# Patient Record
Sex: Female | Born: 1945 | Race: Black or African American | Hispanic: No | Smoking: Former smoker
Health system: Southern US, Community
[De-identification: ages and names within clinical notes are randomized; demographics above are authoritative.]

## PROBLEM LIST (undated history)

## (undated) DIAGNOSIS — R87629 Unspecified abnormal cytological findings in specimens from vagina: Secondary | ICD-10-CM

## (undated) DIAGNOSIS — D649 Anemia, unspecified: Secondary | ICD-10-CM

## (undated) DIAGNOSIS — K37 Unspecified appendicitis: Secondary | ICD-10-CM

## (undated) DIAGNOSIS — M1990 Unspecified osteoarthritis, unspecified site: Secondary | ICD-10-CM

## (undated) DIAGNOSIS — M8580 Other specified disorders of bone density and structure, unspecified site: Secondary | ICD-10-CM

## (undated) DIAGNOSIS — H269 Unspecified cataract: Secondary | ICD-10-CM

## (undated) DIAGNOSIS — K7689 Other specified diseases of liver: Secondary | ICD-10-CM

## (undated) DIAGNOSIS — L02215 Cutaneous abscess of perineum: Secondary | ICD-10-CM

## (undated) HISTORY — DX: Unspecified appendicitis: K37

## (undated) HISTORY — DX: Unspecified osteoarthritis, unspecified site: M19.90

## (undated) HISTORY — PX: COLONOSCOPY: SHX174

## (undated) HISTORY — DX: Unspecified abnormal cytological findings in specimens from vagina: R87.629

## (undated) HISTORY — DX: Anemia, unspecified: D64.9

## (undated) HISTORY — PX: UTERINE FIBROID SURGERY: SHX826

## (undated) HISTORY — DX: Other specified disorders of bone density and structure, unspecified site: M85.80

## (undated) HISTORY — DX: Other specified diseases of liver: K76.89

## (undated) HISTORY — DX: Unspecified cataract: H26.9

## (undated) HISTORY — DX: Cutaneous abscess of perineum: L02.215

## (undated) HISTORY — PX: POLYPECTOMY: SHX149

## (undated) HISTORY — PX: WRIST SURGERY: SHX841

---

## 1968-04-22 HISTORY — PX: APPENDECTOMY: SHX54

## 2008-08-24 ENCOUNTER — Emergency Department (HOSPITAL_COMMUNITY): Admission: EM | Admit: 2008-08-24 | Discharge: 2008-08-24 | Payer: Self-pay | Admitting: Emergency Medicine

## 2009-11-02 ENCOUNTER — Emergency Department (HOSPITAL_COMMUNITY): Admission: EM | Admit: 2009-11-02 | Discharge: 2009-11-02 | Payer: Self-pay | Admitting: Emergency Medicine

## 2010-07-08 LAB — POCT I-STAT, CHEM 8
Chloride: 111 mEq/L (ref 96–112)
HCT: 41 % (ref 36.0–46.0)
Hemoglobin: 13.9 g/dL (ref 12.0–15.0)
Potassium: 3.9 mEq/L (ref 3.5–5.1)
Sodium: 142 mEq/L (ref 135–145)
TCO2: 23 mmol/L (ref 0–100)

## 2010-07-08 LAB — POCT CARDIAC MARKERS: CKMB, poc: <1 ng/mL — ABNORMAL LOW (ref 1.0–8.0)

## 2010-09-17 ENCOUNTER — Emergency Department (HOSPITAL_COMMUNITY)
Admission: EM | Admit: 2010-09-17 | Discharge: 2010-09-17 | Disposition: A | Discharge status: Home / Self Care | Payer: Medicare Other | Attending: Emergency Medicine | Admitting: Emergency Medicine

## 2010-09-17 DIAGNOSIS — H11419 Vascular abnormalities of conjunctiva, unspecified eye: Secondary | ICD-10-CM | POA: Insufficient documentation

## 2010-09-17 DIAGNOSIS — H1089 Other conjunctivitis: Secondary | ICD-10-CM | POA: Insufficient documentation

## 2010-09-17 DIAGNOSIS — H5789 Other specified disorders of eye and adnexa: Secondary | ICD-10-CM | POA: Insufficient documentation

## 2010-11-22 ENCOUNTER — Emergency Department (HOSPITAL_COMMUNITY)
Admission: EM | Admit: 2010-11-22 | Discharge: 2010-11-22 | Disposition: A | Discharge status: Home / Self Care | Payer: Medicare Other | Attending: Emergency Medicine | Admitting: Emergency Medicine

## 2010-11-22 DIAGNOSIS — H109 Unspecified conjunctivitis: Secondary | ICD-10-CM | POA: Insufficient documentation

## 2010-11-22 DIAGNOSIS — H11419 Vascular abnormalities of conjunctiva, unspecified eye: Secondary | ICD-10-CM | POA: Insufficient documentation

## 2010-11-22 DIAGNOSIS — H5789 Other specified disorders of eye and adnexa: Secondary | ICD-10-CM | POA: Insufficient documentation

## 2012-01-15 ENCOUNTER — Emergency Department (HOSPITAL_COMMUNITY): Payer: Medicare Other

## 2012-01-15 ENCOUNTER — Emergency Department (HOSPITAL_COMMUNITY)
Admission: EM | Admit: 2012-01-15 | Discharge: 2012-01-15 | Disposition: A | Discharge status: Home / Self Care | Payer: Medicare Other | Attending: Emergency Medicine | Admitting: Emergency Medicine

## 2012-01-15 ENCOUNTER — Encounter (HOSPITAL_COMMUNITY): Payer: Self-pay | Admitting: Emergency Medicine

## 2012-01-15 DIAGNOSIS — R51 Headache: Secondary | ICD-10-CM | POA: Insufficient documentation

## 2012-01-15 DIAGNOSIS — R079 Chest pain, unspecified: Secondary | ICD-10-CM | POA: Insufficient documentation

## 2012-01-15 DIAGNOSIS — R599 Enlarged lymph nodes, unspecified: Secondary | ICD-10-CM | POA: Insufficient documentation

## 2012-01-15 DIAGNOSIS — R42 Dizziness and giddiness: Secondary | ICD-10-CM | POA: Diagnosis not present

## 2012-01-15 LAB — BASIC METABOLIC PANEL
BUN: 8 mg/dL (ref 6–23)
Calcium: 9.9 mg/dL (ref 8.4–10.5)
Creatinine, Ser: 0.9 mg/dL (ref 0.50–1.10)
GFR calc Af Amer: 76 mL/min — ABNORMAL LOW (ref >90)
Potassium: 3.9 mEq/L (ref 3.5–5.1)
Sodium: 142 mEq/L (ref 135–145)

## 2012-01-15 LAB — CBC WITH DIFFERENTIAL/PLATELET
HCT: 41.3 % (ref 36.0–46.0)
Hemoglobin: 13.9 g/dL (ref 12.0–15.0)
Lymphocytes Relative: 23 % (ref 12–46)
MCHC: 33.7 g/dL (ref 30.0–36.0)
MCV: 88.4 fL (ref 78.0–100.0)
Platelets: 310 10*3/uL (ref 150–400)
WBC: 5.3 10*3/uL (ref 4.0–10.5)

## 2012-01-15 MED ORDER — HYDROCODONE-ACETAMINOPHEN 5-325 MG PO TABS
2.0000 | ORAL_TABLET | Freq: Once | ORAL | Status: AC
  Administered 2012-01-15: 2 via ORAL
  Filled 2012-01-15: qty 2

## 2012-01-15 MED ORDER — IOHEXOL 350 MG/ML SOLN
50.0000 mL | Freq: Once | INTRAVENOUS | Status: AC | PRN
  Administered 2012-01-15: 50 mL via INTRAVENOUS

## 2012-01-15 NOTE — ED Provider Notes (Signed)
History  This chart was scribed for Glynn Octave, MD by Ardeen Jourdain. This patient was seen in room TR09C/TR09C and the patient's care was started at 1102.  CSN: [id number]  Arrival date & time 01/15/12  1000   First MD Initiated Contact with Patient 01/15/12 1102      No chief complaint on file.    HPI Sherry Wilkinson is a 66 y.o. female who presents to the Emergency Department complaining of HA gradual onset, gradually worsening that started 6-8 weeks ago. She has associated non-spinning dizziness, and feeling of "water moving back and forth" in her head. She states she wakes up with a headache in the morning that improves throughout the day, but the pressure sensation remains. Pt denies fever, neck pain, visual disturbance, CP, cough, SOB, abdominal pain, nausea, emesis, diarrhea, back pain, weakness, numbness and rash as associated symptoms. She does not have a h/o chronic medical conditions. She denies smoking and alcohol use.   History reviewed. No pertinent past medical history.  No past surgical history on file.  No family history on file.  History  Substance Use Topics  . Smoking status: Never Smoker   . Smokeless tobacco: Not on file  . Alcohol Use: No   No OB history available.   Review of Systems A complete 10 system review of systems was obtained and all systems are negative except as noted in the HPI and PMH.   Allergies  Review of patient's allergies indicates no known allergies.  Home Medications  No current outpatient prescriptions on file.  Triage Vitals: BP 122/64  Pulse 68  Temp 98.2 F (36.8 C) (Oral)  Resp 18  SpO2 100%  Physical Exam  Nursing note and vitals reviewed. Constitutional: She is oriented to person, place, and time. She appears well-developed and well-nourished. No distress.  HENT:  Head: Normocephalic and atraumatic.  Eyes: EOM are normal. Pupils are equal, round, and reactive to light.       No nystagmus. Visual fields  fully confrontational.   Neck: Normal range of motion. Neck supple. No tracheal deviation present.  Cardiovascular: Normal rate, regular rhythm and normal heart sounds.   Pulmonary/Chest: Effort normal and breath sounds normal. No respiratory distress. She has no wheezes.  Abdominal: Soft.  Musculoskeletal: Normal range of motion.       Strength 5/5 in all extremities. Equal grip strength.   Neurological: She is alert and oriented to person, place, and time. No cranial nerve deficit.       Finger nose finger test normal. Negative pronator drift. Gait normal. Cranial nerves intact. No nystagmus. No ataxia. Negative Romberg's.   Skin: Skin is warm and dry.  Psychiatric: She has a normal mood and affect. Her behavior is normal.    ED Course  Procedures (including critical care time)  DIAGNOSTIC STUDIES: Oxygen Saturation is 100% on room air, normal by my interpretation.    COORDINATION OF CARE:  11:20-Discussed planned course of treatment with the patient including pain medication, CT of head and neck, blood work and EKG, who is agreeable at this time.   11:45-Medication Orders: Hydrocodone-acetaminophen (Norco/Vicodin) 5-325 mg per tablet 2 tablet-once  Labs Reviewed  BASIC METABOLIC PANEL - Abnormal; Notable for the following:    GFR calc non Af Amer 65 (*)     GFR calc Af Amer 76 (*)     All other components within normal limits  CBC WITH DIFFERENTIAL  TROPONIN I  LAB REPORT - SCANNED   Ct Angio Head  W/cm &/or Wo Cm  01/15/2012  *RADIOLOGY REPORT*  Clinical Data:  Headache  CT ANGIOGRAPHY HEAD AND NECK  Technique:  Multidetector CT imaging of the head and neck was performed using the standard protocol during bolus administration of intravenous contrast.  Multiplanar CT image reconstructions including MIPs were obtained to evaluate the vascular anatomy. Carotid stenosis measurements (when applicable) are obtained utilizing NASCET criteria, using the distal internal carotid diameter  as the denominator.  Contrast: 50mL OMNIPAQUE IOHEXOL 350 MG/ML SOLN  Comparison:   None.  CTA NECK  Findings:  Negative for mass or adenopathy in the neck.  8 mm right thyroid nodule.  Lung apices are clear.  Carotid artery is widely patent bilaterally without evidence of dissection or atherosclerotic disease.  Both vertebral arteries are widely patent to the basilar without stenosis.  Right vertebral artery is dominant.  No significant bony abnormality.   Review of the MIP images confirms the above findings.  IMPRESSION: Negative  CTA HEAD  Findings:  Both vertebral arteries are patent to the basilar.  PICA and AICA are patent bilaterally.  The basilar is widely patent. Superior cerebellar and posterior cerebral arteries are patent bilaterally without stenosis.  Cavernous carotid is widely patent without atherosclerotic disease. Anterior and middle cerebral arteries are widely patent bilaterally.  Negative for cerebral aneurysm.  Ventricle size is normal.  Mild patchy hypodensity in the cerebral white matter bilaterally compatible chronic microvascular ischemia. No acute infarct.  Negative for hemorrhage or mass.  Normal enhancement following contrast administration.   Review of the MIP images confirms the above findings.  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  No acute intracranial abnormality.  No significant intracranial vascular abnormality.   Original Report Authenticated By: Camelia Phenes, M.D.    Ct Angio Neck W/cm &/or Wo/cm  01/15/2012  *RADIOLOGY REPORT*  Clinical Data:  Headache  CT ANGIOGRAPHY HEAD AND NECK  Technique:  Multidetector CT imaging of the head and neck was performed using the standard protocol during bolus administration of intravenous contrast.  Multiplanar CT image reconstructions including MIPs were obtained to evaluate the vascular anatomy. Carotid stenosis measurements (when applicable) are obtained utilizing NASCET criteria, using the distal  internal carotid diameter as the denominator.  Contrast: 50mL OMNIPAQUE IOHEXOL 350 MG/ML SOLN  Comparison:   None.  CTA NECK  Findings:  Negative for mass or adenopathy in the neck.  8 mm right thyroid nodule.  Lung apices are clear.  Carotid artery is widely patent bilaterally without evidence of dissection or atherosclerotic disease.  Both vertebral arteries are widely patent to the basilar without stenosis.  Right vertebral artery is dominant.  No significant bony abnormality.   Review of the MIP images confirms the above findings.  IMPRESSION: Negative  CTA HEAD  Findings:  Both vertebral arteries are patent to the basilar.  PICA and AICA are patent bilaterally.  The basilar is widely patent. Superior cerebellar and posterior cerebral arteries are patent bilaterally without stenosis.  Cavernous carotid is widely patent without atherosclerotic disease. Anterior and middle cerebral arteries are widely patent bilaterally.  Negative for cerebral aneurysm.  Ventricle size is normal.  Mild patchy hypodensity in the cerebral white matter bilaterally compatible chronic microvascular ischemia. No acute infarct.  Negative for hemorrhage or mass.  Normal enhancement following contrast administration.   Review of the MIP images confirms the above findings.  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  No acute intracranial abnormality.  No significant  intracranial vascular abnormality.   Original Report Authenticated By: Camelia Phenes, M.D.      1. Headache       MDM  Daily posterior headaches x 8 weeks, resolving throughout day.  "water rushing" feeling.  No vertigo, weakness, numbness, tingling, vision changes.  nonfocal neurological exam, no vertigo, nystagmus, ataxia.  Normal gait. No focal motor or sensory deficit.  CT head to rule out anatomic pathology. No headache at this time.  Follow up with neuro.   Date: 01/15/2012  Rate: 59  Rhythm: normal sinus rhythm  QRS  Axis: normal  Intervals: normal  ST/T Wave abnormalities: normal  Conduction Disutrbances:none  Narrative Interpretation:   Old EKG Reviewed: unchanged         Glynn Octave, MD 01/16/12 1041

## 2012-01-15 NOTE — ED Notes (Signed)
Ambulatory without difficulty- denies dizziness, n/v or further complaints.

## 2012-01-15 NOTE — ED Notes (Signed)
H/a that has awaken her 6-8 times over the last  6-8 months no blurred vision some dizziness  Pain is in back of her head

## 2012-07-22 ENCOUNTER — Ambulatory Visit (INDEPENDENT_AMBULATORY_CARE_PROVIDER_SITE_OTHER): Payer: Medicare Other | Admitting: Obstetrics and Gynecology

## 2012-07-22 ENCOUNTER — Encounter: Payer: Self-pay | Admitting: Obstetrics and Gynecology

## 2012-07-22 DIAGNOSIS — G8929 Other chronic pain: Secondary | ICD-10-CM

## 2012-07-22 DIAGNOSIS — R1031 Right lower quadrant pain: Secondary | ICD-10-CM | POA: Insufficient documentation

## 2012-07-22 NOTE — Progress Notes (Signed)
Patient reports pain on her RLQ.

## 2012-07-22 NOTE — Progress Notes (Signed)
Patient ID: Sherry Wilkinson, female   DOB: 1945-09-09, 68 y.o.   MRN: [id number] 67 yo G3P0030 presenting today as a referral for evaluation of lesion seen on her cervix at the time of her cervical screening test during the free session. Patient denies any issues. She has been postmenopausal for greater than 15 years. Her pap smear this year was normal. She also reports the onset of RLQ pain over the past two weeks. The pain is stabbing, non-radiating. Ibuprofen alleviates the pain a little and there are no aggravating factors. She continues to have good appetite and reports regular bowel movement.  Abdomen: Soft, mild tenderness in RLQ, no rebound, no guarding Pelvic: Normal external genitalia. Normal appearing vagina, slightly atrophic. Normal cervix without any lesions. Small uterus, no adnexal masses or tenderness.  A/P 67 yo G3P0030 with RLQ pain -Normal cervix - Will obtain pelvic ultrasound for patient reassurance - Will refer to Primary care for evaluation of RLQ pain

## 2012-07-28 ENCOUNTER — Ambulatory Visit (HOSPITAL_COMMUNITY)
Admission: RE | Admit: 2012-07-28 | Discharge: 2012-07-28 | Disposition: A | Discharge status: Home / Self Care | Payer: Medicare Other | Source: Ambulatory Visit | Attending: Obstetrics and Gynecology | Admitting: Obstetrics and Gynecology

## 2012-07-28 DIAGNOSIS — R1031 Right lower quadrant pain: Secondary | ICD-10-CM | POA: Insufficient documentation

## 2012-07-28 DIAGNOSIS — Z780 Asymptomatic menopausal state: Secondary | ICD-10-CM | POA: Diagnosis not present

## 2012-07-28 DIAGNOSIS — D250 Submucous leiomyoma of uterus: Secondary | ICD-10-CM | POA: Insufficient documentation

## 2012-07-28 DIAGNOSIS — N949 Unspecified condition associated with female genital organs and menstrual cycle: Secondary | ICD-10-CM | POA: Insufficient documentation

## 2012-07-28 DIAGNOSIS — D259 Leiomyoma of uterus, unspecified: Secondary | ICD-10-CM | POA: Diagnosis not present

## 2012-07-29 ENCOUNTER — Telehealth: Payer: Self-pay | Admitting: Obstetrics and Gynecology

## 2012-07-29 ENCOUNTER — Telehealth: Payer: Self-pay | Admitting: General Practice

## 2012-07-29 NOTE — Telephone Encounter (Addendum)
Message copied by Toula Moos on Wed Jul 29, 2012  8:43 AM   ------CALLED PATIENT and notified of ultrasound result and that no interventions needed. Patient satisfied.         Message from: CONSTANT, PEGGY      Created: Tue Jul 28, 2012  4:10 PM       Patient will be contacted with ultrasound results for which no interventions are needed ------

## 2012-07-29 NOTE — Telephone Encounter (Signed)
Message copied by Kathee Delton on Wed Jul 29, 2012  1:14 PM ------      Message from: CONSTANT, Gigi Gin      Created: Tue Jul 28, 2012  4:08 PM       Please inform patient of a normal pelvic ultrasound which demonstrates the presence of 2 small fibroids for which no interventions are needed and normal ovaries            Peggy ------

## 2012-07-29 NOTE — Telephone Encounter (Signed)
Called patient, no answer- left message to call us back. 

## 2012-07-29 NOTE — Telephone Encounter (Signed)
Patient called back stating she was returning our phone call. Called patient back and informed her of normal ultrasound results. Patient verbalized understanding and had no further questions

## 2012-07-30 ENCOUNTER — Ambulatory Visit: Payer: Medicare Other | Admitting: Family Medicine

## 2012-08-04 ENCOUNTER — Ambulatory Visit (INDEPENDENT_AMBULATORY_CARE_PROVIDER_SITE_OTHER): Payer: Medicare Other | Admitting: Family Medicine

## 2012-08-04 ENCOUNTER — Encounter: Payer: Self-pay | Admitting: Family Medicine

## 2012-08-04 VITALS — BP 116/73 | HR 77 | Temp 98.7°F | Ht 61.0 in | Wt 142.0 lb

## 2012-08-04 DIAGNOSIS — K921 Melena: Secondary | ICD-10-CM | POA: Diagnosis not present

## 2012-08-04 DIAGNOSIS — R1011 Right upper quadrant pain: Secondary | ICD-10-CM

## 2012-08-04 DIAGNOSIS — Z1239 Encounter for other screening for malignant neoplasm of breast: Secondary | ICD-10-CM | POA: Diagnosis not present

## 2012-08-04 DIAGNOSIS — Z01419 Encounter for gynecological examination (general) (routine) without abnormal findings: Secondary | ICD-10-CM | POA: Insufficient documentation

## 2012-08-04 DIAGNOSIS — K7689 Other specified diseases of liver: Secondary | ICD-10-CM | POA: Diagnosis not present

## 2012-08-04 LAB — COMPREHENSIVE METABOLIC PANEL
ALT: 13 U/L (ref 0–35)
Alkaline Phosphatase: 77 U/L (ref 39–117)
BUN: 12 mg/dL (ref 6–23)
CO2: 25 mEq/L (ref 19–32)
Calcium: 10 mg/dL (ref 8.4–10.5)
Chloride: 105 mEq/L (ref 96–112)
Glucose, Bld: 97 mg/dL (ref 70–99)
Potassium: 4.2 mEq/L (ref 3.5–5.3)
Total Protein: 7.6 g/dL (ref 6.0–8.3)

## 2012-08-04 NOTE — Patient Instructions (Signed)
Abdominal Pain  Abdominal pain can be caused by many things. Your caregiver decides the seriousness of your pain by an examination and possibly blood tests and X-rays. Many cases can be observed and treated at home. Most abdominal pain is not caused by a disease and will probably improve without treatment. However, in many cases, more time must pass before a clear cause of the pain can be found. Before that point, it may not be known if you need more testing, or if hospitalization or surgery is needed.  HOME CARE INSTRUCTIONS   · Do not take laxatives unless directed by your caregiver.  · Take pain medicine only as directed by your caregiver.  · Only take over-the-counter or prescription medicines for pain, discomfort, or fever as directed by your caregiver.  · Try a clear liquid diet (broth, tea, or water) for as long as directed by your caregiver. Slowly move to a bland diet as tolerated.  SEEK IMMEDIATE MEDICAL CARE IF:   · The pain does not go away.  · You have a fever.  · You keep throwing up (vomiting).  · The pain is felt only in portions of the abdomen. Pain in the right side could possibly be appendicitis. In an adult, pain in the left lower portion of the abdomen could be colitis or diverticulitis.  · You pass bloody or black tarry stools.  MAKE SURE YOU:   · Understand these instructions.  · Will watch your condition.  · Will get help right away if you are not doing well or get worse.  Document Released: 01/16/2005 Document Revised: 07/01/2011 Document Reviewed: 11/25/2007  Exit[employer]® Patient Information ©2013 [employer].

## 2012-08-04 NOTE — Progress Notes (Signed)
Subjective:     Patient ID: Sherry Wilkinson, female   DOB: June 03, 1945, 67 y.o.   MRN: [id number]  HPI RUQ abdominal pain:Recwently started having RUQ abdominal pain in the last few weeks on and off,she denies any change in BM,she noticed blood in her stool 2 wks ago,she feels ok today. Liver cyst:She was told many years ago she has cyst in her liver,she is here for follow up,no change in skin color,she has RUQ abdominal pain on and off,currently asymptomatic. Blood in stool:2 wks ago she noticed blood in her stool one time,since then no other episode,she denies any change in her bowel movement,she does have occasional right side abdominal pain. Health maintenance;Not had colonoscopy and mammogram recently,she had PAP this year which was normal.  Past Medical History  Diagnosis Date  . Liver cyst     Review of Systems  Respiratory: Negative.   Cardiovascular: Negative.   Gastrointestinal: Positive for abdominal pain and blood in stool. Negative for nausea, vomiting, diarrhea, constipation and rectal pain.  Genitourinary: Negative.   Neurological: Negative.   All other systems reviewed and are negative.    Filed Vitals:   08/04/12 0907  BP: 116/73  Pulse: 77  Temp: 98.7 F (37.1 C)  TempSrc: Oral  Height: 5\' 1"  (1.549 m)  Weight: 142 lb (64.411 kg)       Objective:   Physical Exam  Nursing note and vitals reviewed. Constitutional: She is oriented to person, place, and time. She appears well-developed. No distress.  Cardiovascular: Normal rate, regular rhythm, normal heart sounds and intact distal pulses.   No murmur heard. Pulmonary/Chest: Effort normal and breath sounds normal. No respiratory distress. She has no wheezes. She exhibits no tenderness.  Abdominal: Soft. Bowel sounds are normal. She exhibits no distension and no mass. There is no tenderness. There is no rebound and no guarding.  Musculoskeletal: Normal range of motion. She exhibits no edema.  Neurological:  She is alert and oriented to person, place, and time.  Psychiatric: She has a normal mood and affect.       Assessment:     RLQ abdominal pain: Liver cyst: Blood in stool: Health maintenance; Breast cancer screening    Plan:     1. Abdominal exam benign,CMP ordered today. If abnormal will consider imaging her liver.  2. CMP ordered today.No sign suggestive of liver disease.  3. Referral to GI for colonoscopy done.  4. Up to dte with PAP test.I referred her to GI for colonoscopy and mammogram was ordered.

## 2012-08-04 NOTE — Assessment & Plan Note (Signed)
  Health maintenance;breast cancer screening Up to dte with PAP test.I referred her to GI for colonoscopy and mammogram was ordered.

## 2012-08-04 NOTE — Assessment & Plan Note (Signed)
RLQ abdominal pain: Abdominal exam benign,CMP ordered today. If abnormal will consider imaging her liver.

## 2012-08-04 NOTE — Assessment & Plan Note (Signed)
  Blood in stool:  Referral to GI for colonoscopy done.

## 2012-08-04 NOTE — Assessment & Plan Note (Signed)
  Liver cyst: CMP ordered today.No sign suggestive of liver disease.

## 2012-08-06 ENCOUNTER — Encounter: Payer: Self-pay | Admitting: Internal Medicine

## 2012-08-11 ENCOUNTER — Ambulatory Visit (HOSPITAL_COMMUNITY)
Admission: RE | Admit: 2012-08-11 | Discharge: 2012-08-11 | Disposition: A | Discharge status: Home / Self Care | Payer: Medicare Other | Source: Ambulatory Visit | Attending: Family Medicine | Admitting: Family Medicine

## 2012-08-11 DIAGNOSIS — Z1239 Encounter for other screening for malignant neoplasm of breast: Secondary | ICD-10-CM

## 2012-08-11 DIAGNOSIS — Z1231 Encounter for screening mammogram for malignant neoplasm of breast: Secondary | ICD-10-CM | POA: Insufficient documentation

## 2012-08-18 ENCOUNTER — Other Ambulatory Visit: Payer: Self-pay | Admitting: Family Medicine

## 2012-08-18 DIAGNOSIS — R928 Other abnormal and inconclusive findings on diagnostic imaging of breast: Secondary | ICD-10-CM

## 2012-08-26 ENCOUNTER — Encounter: Payer: Self-pay | Admitting: Internal Medicine

## 2012-08-28 ENCOUNTER — Ambulatory Visit
Admission: RE | Admit: 2012-08-28 | Discharge: 2012-08-28 | Disposition: A | Discharge status: Home / Self Care | Payer: Medicare Other | Source: Ambulatory Visit | Attending: Family Medicine | Admitting: Family Medicine

## 2012-08-28 DIAGNOSIS — R928 Other abnormal and inconclusive findings on diagnostic imaging of breast: Secondary | ICD-10-CM

## 2012-09-01 ENCOUNTER — Ambulatory Visit (INDEPENDENT_AMBULATORY_CARE_PROVIDER_SITE_OTHER): Payer: Medicare Other | Admitting: Internal Medicine

## 2012-09-01 ENCOUNTER — Encounter: Payer: Self-pay | Admitting: Internal Medicine

## 2012-09-01 VITALS — BP 110/80 | HR 79 | Ht 61.5 in | Wt 144.8 lb

## 2012-09-01 DIAGNOSIS — K7689 Other specified diseases of liver: Secondary | ICD-10-CM

## 2012-09-01 DIAGNOSIS — Z1211 Encounter for screening for malignant neoplasm of colon: Secondary | ICD-10-CM | POA: Diagnosis not present

## 2012-09-01 DIAGNOSIS — K625 Hemorrhage of anus and rectum: Secondary | ICD-10-CM | POA: Diagnosis not present

## 2012-09-01 DIAGNOSIS — R109 Unspecified abdominal pain: Secondary | ICD-10-CM

## 2012-09-01 NOTE — Progress Notes (Addendum)
Patient ID: Sherry Wilkinson, female   DOB: 07/18/1945, 67 y.o.   MRN: [id number] HPI: Sherry Wilkinson is a 67 yo female with little past medical history he was seen in consultation at the request of Dr. Lum Babe for evaluation of RLQ abd pain and blood in her stool.  She reports several months ago developing right lower quadrant abdominal pain which lasted approximately 8 weeks. At times his pain was severe and she rated it 10 out of 10. She reports it was so bad she had to use over-the-counter ibuprofen for the pain which did help. Her pain at that time did not necessarily related to bowel movement. No change with eating. Sometimes worse with moving. The pain now is almost completely resolved though occasionally she will report a "twinge" of similar pain. She reports she is eating well, without nausea or vomiting. No trouble with significant heartburn. She reports regular bowel movements without diarrhea or constipation. She does report an isolated episode of red blood mixed with her stool occurring several months ago. She does not note this is happened recently as she does not frequently look at her stools. She reports a remote colonoscopy about 10 years ago or more occurring in [place] which she recalls being normal. She does report over 30 year history of hemorrhoids, though none recently that she knows of.  She does have a history of appendectomy and tubal ligation.  She recalls being told in the past she had a benign "liver cyst". This was previously imaged, though not in our medical system. She feels that she has these imaging studies at home  Patient Active Problem List   Diagnosis Date Noted  . Liver cyst 08/04/2012  . Abdominal pain, right upper quadrant 08/04/2012  . Blood in stool 08/04/2012  . Breast cancer screening 08/04/2012  . RLQ abdominal pain 07/22/2012    Past Surgical History  Procedure Laterality Date  . Opened tubes    . Appendectomy      No current outpatient prescriptions  on file.   No current facility-administered medications for this visit.    No Known Allergies  Family History  Problem Relation Age of Onset  . Heart disease Mother   . Heart disease Maternal Grandmother     History  Substance Use Topics  . Smoking status: Never Smoker   . Smokeless tobacco: Never Used  . Alcohol Use: No    ROS: As per history of present illness, otherwise negative  BP 110/80  Pulse 79  Ht 5' 1.5" (1.562 m)  Wt 144 lb 12.8 oz (65.681 kg)  BMI 26.92 kg/m2  SpO2 98% Constitutional: Well-developed and well-nourished. No distress. HEENT: Normocephalic and atraumatic. Oropharynx is clear and moist. No oropharyngeal exudate. Conjunctivae are normal.  No scleral icterus. Neck: Neck supple. Trachea midline. Cardiovascular: Normal rate, regular rhythm and intact distal pulses.  Pulmonary/chest: Effort normal and breath sounds normal. No wheezing, rales or rhonchi. Abdominal: Soft, nontender, nondistended. Bowel sounds active throughout. There are no masses palpable. No hepatosplenomegaly. Extremities: no clubbing, cyanosis, or edema Neurological: Alert and oriented to person place and time. Skin: Skin is warm and dry. No rashes noted. Psychiatric: Normal mood and affect. Behavior is normal.  RELEVANT LABS AND IMAGING: CBC    Component Value Date/Time   WBC 5.3 01/15/2012 1123   RBC 4.67 01/15/2012 1123   HGB 13.9 01/15/2012 1123   HCT 41.3 01/15/2012 1123   PLT 310 01/15/2012 1123   MCV 88.4 01/15/2012 1123   MCH 29.8  01/15/2012 1123   MCHC 33.7 01/15/2012 1123   RDW 13.6 01/15/2012 1123   LYMPHSABS 1.2 01/15/2012 1123   MONOABS 0.4 01/15/2012 1123   EOSABS 0.1 01/15/2012 1123   BASOSABS 0.0 01/15/2012 1123    CMP     Component Value Date/Time   NA 141 08/04/2012 0932   K 4.2 08/04/2012 0932   CL 105 08/04/2012 0932   CO2 25 08/04/2012 0932   GLUCOSE 97 08/04/2012 0932   BUN 12 08/04/2012 0932   CREATININE 0.98 08/04/2012 0932   CREATININE 0.90 01/15/2012 1123    CALCIUM 10.0 08/04/2012 0932   PROT 7.6 08/04/2012 0932   ALBUMIN 4.5 08/04/2012 0932   AST 19 08/04/2012 0932   ALT 13 08/04/2012 0932   ALKPHOS 77 08/04/2012 0932   BILITOT 0.4 08/04/2012 0932   GFRNONAA 65* 01/15/2012 1123   GFRAA 76* 01/15/2012 1123    ASSESSMENT/PLAN:  67 yo female with little past medical history he was seen in consultation at the request of Dr. Lum Babe for evaluation of RLQ abd pain and blood in her stool.   1.  RLQ abd pain, isolated blood in stool -- the patient's lower abdominal pain has significantly improved and she is nontoxic-appearing today. She did have an isolated episode of hematochezia, but no bleeding lately. I have recommended a colonoscopy both for colon cancer screening, but also to further evaluate her rectal bleeding and pain. We discussed colonoscopy today including the risks and benefits, but she does not have the availability of a care partner.  I have explained that we cannot perform a sedated colonoscopy in our endoscopy center without a care partner who can be responsible for her including transportation after the procedure.  Given this fact, which does not seem to have an easy solution, I recommended either a virtual colonoscopy or barium enema. We will order a virtual colonoscopy, and she will contact her insurance provider to ensure that it is covered. If not she will likely need barium enema. She understands that if either of these studies are abnormal, colonoscopy would likely be the next recommended test. I've asked that she let us know immediately should her pain or bleeding return. She voices understanding.  2.  Liver cyst -- have asked that she provide copies of her previous liver imaging for our review. There are multiple possibilities for liver lesions and followup for monitoring of such would be based on the type of the lesion.  She feels confident she has these records and will send for our review.  Addendum: CT scan received from [facility] in [place] dated 09[place]09/2000 - results to be scanned  Conclusion: "There are subcentimeter hepatic low-density lesions, likely cysts, although too small to accurately characterize. Findings compatible with a perfusion defect at the medial aspect of the left hepatic lobe. Initial flow related artifact in the superior mesenteric vein, dissipating on delayed imaging. Small right fundal uterine leiomyoma." --Based on these findings I do not feel further workup of liver cysts is indicated at this time. This scan was 13 years ago, and liver enzymes recently were normal

## 2012-09-01 NOTE — Patient Instructions (Addendum)
You will be contacted by [facility] to schedule your Ct Colonography, if you do not hear from them by the end of the week please call them at [phone]   If you cannot get this done please call our office back and schedule a Barium Enema  [phone]                                                 We are excited to introduce MyChart, a new best-in-class service that provides you online access to important information in your electronic medical record. We want to make it easier for you to view your health information - all in one secure location - when and where you need it. We expect MyChart will enhance the quality of care and service we provide.  When you register for MyChart, you can:    View your test results.    Request appointments and receive appointment reminders via email.    Request medication renewals.    View your medical history, allergies, medications and immunizations.    Communicate with your physician's office through a password-protected site.    Conveniently print information such as your medication lists.  To find out if MyChart is right for you, please talk to a member of our clinical staff today. We will gladly answer your questions about this free health and wellness tool.  If you are age 42 or older and want a member of your family to have access to your record, you must provide written consent by completing a proxy form available at our office. Please speak to our clinical staff about guidelines regarding accounts for patients younger than age 103.  As you activate your MyChart account and need any technical assistance, please call the MyChart technical support line at [phone]) [phone] [phone]) or email your question to [email or web address]. If you email your question(s), please include your name, a return phone number and the best time to reach you.  If you have non-urgent health-related questions, you can send a message to our office  through MyChart at [email or web address]. If you have a medical emergency, call 911.  Thank you for using MyChart as your new health and wellness resource!   MyChart licensed from [employer],  [id number]. Patents Pending.

## 2012-09-02 ENCOUNTER — Telehealth: Payer: Self-pay | Admitting: Internal Medicine

## 2012-09-02 DIAGNOSIS — K625 Hemorrhage of anus and rectum: Secondary | ICD-10-CM

## 2012-09-02 DIAGNOSIS — Z1211 Encounter for screening for malignant neoplasm of colon: Secondary | ICD-10-CM

## 2012-09-02 NOTE — Telephone Encounter (Signed)
Spoke to pt told her dr pyrtle would like her to get a barium enema, pt was fine that, scheduled barium enema on 09/08/2012 @ 9:15 told pt to stop over at [facility] to pick up the kit. Pt verbalized understanding

## 2012-09-02 NOTE — Telephone Encounter (Signed)
lvm for pt to call me back; pt to be scheduled a Barium Enima per Dr. Rhea Belton

## 2012-09-02 NOTE — Telephone Encounter (Signed)
Pt reports she wants to have the COLON at the hospital; verified this with her because Dr Lauro Franklin OV note mentioned 3 possible procedure. Informed pt we are trying to work out time at the hospital and either myself or Adonis Housekeeper, CMA will call her back. Pt stated understanding.

## 2012-09-02 NOTE — Telephone Encounter (Signed)
Line busy

## 2012-09-08 ENCOUNTER — Encounter: Payer: Self-pay | Admitting: Internal Medicine

## 2012-09-08 ENCOUNTER — Ambulatory Visit (HOSPITAL_COMMUNITY)
Admission: RE | Admit: 2012-09-08 | Discharge: 2012-09-08 | Disposition: A | Discharge status: Home / Self Care | Payer: Medicare Other | Source: Ambulatory Visit | Attending: Internal Medicine | Admitting: Internal Medicine

## 2012-09-08 DIAGNOSIS — K5730 Diverticulosis of large intestine without perforation or abscess without bleeding: Secondary | ICD-10-CM | POA: Insufficient documentation

## 2012-09-08 DIAGNOSIS — Z1211 Encounter for screening for malignant neoplasm of colon: Secondary | ICD-10-CM

## 2012-09-08 DIAGNOSIS — K921 Melena: Secondary | ICD-10-CM | POA: Diagnosis not present

## 2012-09-08 DIAGNOSIS — K625 Hemorrhage of anus and rectum: Secondary | ICD-10-CM | POA: Diagnosis not present

## 2012-09-08 DIAGNOSIS — R933 Abnormal findings on diagnostic imaging of other parts of digestive tract: Secondary | ICD-10-CM | POA: Insufficient documentation

## 2012-09-21 ENCOUNTER — Telehealth: Payer: Self-pay | Admitting: *Deleted

## 2012-09-21 NOTE — Telephone Encounter (Signed)
Mailed pt a letter with explanation and the need for her to contact us for a COLON. Mailed a pamphlet on diverticulosis also.

## 2012-09-21 NOTE — Telephone Encounter (Signed)
Message copied by Florene Glen on Mon Sep 21, 2012 10:35 AM ------      Message from: Beverley Fiedler      Created: Tue Sep 08, 2012  5:59 PM       Barium enema shows a possible 2 cm polyp in the sigmoid colon. I strongly recommend proceeding to colonoscopy for direct visualization and possible polypectomy      She does have scattered diverticulosis in the left colon      (She did have possible issues with transportation, which will need to be discussed and remedied so that she can have colonoscopy) ------

## 2012-09-24 ENCOUNTER — Telehealth: Payer: Self-pay | Admitting: Gastroenterology

## 2012-09-24 NOTE — Telephone Encounter (Signed)
Spoke with pt concerning the letter and that she needs a COLON for better screening and a BX. There was discussion about her transportation and she has someone who can bring her now to our office and stay at the [facility]. Pt with PV on 10/09/12 with COLON on 10/09/12; pt stated understanding.

## 2012-09-29 ENCOUNTER — Ambulatory Visit (AMBULATORY_SURGERY_CENTER): Payer: Medicare Other | Admitting: *Deleted

## 2012-09-29 VITALS — Ht 61.5 in | Wt 145.6 lb

## 2012-09-29 DIAGNOSIS — D126 Benign neoplasm of colon, unspecified: Secondary | ICD-10-CM

## 2012-09-29 MED ORDER — MOVIPREP 100 G PO SOLR
1.0000 | Freq: Once | ORAL | Status: DC
Start: 2012-09-29 — End: 2012-10-09

## 2012-09-29 NOTE — Progress Notes (Signed)
Patient assigned video through Emmi. 

## 2012-09-29 NOTE — Progress Notes (Signed)
No allergies to eggs or soy products. No difficulties with anesthesia or sedation.

## 2012-10-09 ENCOUNTER — Encounter: Payer: Self-pay | Admitting: Internal Medicine

## 2012-10-09 ENCOUNTER — Ambulatory Visit (AMBULATORY_SURGERY_CENTER): Payer: Medicare Other | Admitting: Internal Medicine

## 2012-10-09 VITALS — BP 113/69 | HR 53 | Temp 98.2°F | Resp 13 | Ht 61.0 in | Wt 145.0 lb

## 2012-10-09 DIAGNOSIS — D126 Benign neoplasm of colon, unspecified: Secondary | ICD-10-CM

## 2012-10-09 DIAGNOSIS — R933 Abnormal findings on diagnostic imaging of other parts of digestive tract: Secondary | ICD-10-CM | POA: Diagnosis not present

## 2012-10-09 DIAGNOSIS — K921 Melena: Secondary | ICD-10-CM

## 2012-10-09 DIAGNOSIS — R1031 Right lower quadrant pain: Secondary | ICD-10-CM

## 2012-10-09 DIAGNOSIS — K625 Hemorrhage of anus and rectum: Secondary | ICD-10-CM | POA: Diagnosis not present

## 2012-10-09 DIAGNOSIS — R1011 Right upper quadrant pain: Secondary | ICD-10-CM

## 2012-10-09 DIAGNOSIS — Z538 Procedure and treatment not carried out for other reasons: Secondary | ICD-10-CM

## 2012-10-09 DIAGNOSIS — K7689 Other specified diseases of liver: Secondary | ICD-10-CM | POA: Diagnosis not present

## 2012-10-09 MED ORDER — SODIUM CHLORIDE 0.9 % IV SOLN: 500.0000 mL | INTRAVENOUS | Status: DC

## 2012-10-09 MED ORDER — PEG 3350-KCL-NABCB-NACL-NASULF 240 G PO SOLR: 4000.0000 mL | Freq: Once | ORAL | Status: DC

## 2012-10-09 NOTE — Op Note (Signed)
[facility] [address] [place], [address]   COLONOSCOPY PROCEDURE REPORT  PATIENT: Sherry Wilkinson, Sherry Wilkinson  MR#: [id number] BIRTHDATE: 04/22/1946 , 66  yrs. old GENDER: Female ENDOSCOPIST: Beverley Fiedler, MD REFERRED BY: Janit Pagan, MD PROCEDURE DATE:  10/09/2012 PROCEDURE:   Colonoscopy, incomplete ASA CLASS:   Class II INDICATIONS:an abnormal barium enema, Rectal Bleeding, abdominal pain in the lower right quadrant, and first colonoscopy. MEDICATIONS: MAC sedation, administered by CRNA and propofol (Diprivan) 150mg  IV  DESCRIPTION OF PROCEDURE:   After the risks benefits and alternatives of the procedure were thoroughly explained, informed consent was obtained.  A digital rectal exam revealed no rectal mass.   The LB PFC-H190 [id number]  endoscope was introduced through the anus and advanced to the ascending colon. No adverse events experienced.   The quality of the prep was poor, using MoviPrep The instrument was then slowly withdrawn as the colon was fully examined.     COLON FINDINGS: A significant amount of solid and adherent stool was present throughout the entire examined colon.  Poor preparation precluded visualization of the mucosa.  No large polyp seen in the descending or sigmoid colon as suggested by barium enema, but again exam significantly limited by poor preparation.  Examination terminated in the ascending colon due to prep.  Retroflexed views revealed internal hemorrhoids.       The scope was withdrawn and the procedure completed.  COMPLICATIONS: There were no complications.  ENDOSCOPIC IMPRESSION: 1.   POOR PREP - Significant amount of stool was present throughout the entire examined colon 2.   Moderate sized internal hemorrhoids  RECOMMENDATIONS: Schedule repeat colonoscopy with 2 day bowel preparation   eSigned:  Beverley Fiedler, MD 10/09/2012 10:25 AM   cc: The Patient; Janit Pagan, MD

## 2012-10-09 NOTE — Progress Notes (Signed)
Patient did not experience any of the following events: a burn prior to discharge; a fall within the facility; wrong site/side/patient/procedure/implant event; or a hospital transfer or hospital admission upon discharge from the facility. (G8907) Patient did not have preoperative order for IV antibiotic SSI prophylaxis. (G8918)  

## 2012-10-09 NOTE — Patient Instructions (Addendum)
YOU HAD AN ENDOSCOPIC PROCEDURE TODAY AT THE [facility]: Refer to the procedure report that was given to you for any specific questions about what was found during the examination.  If the procedure report does not answer your questions, please call your gastroenterologist to clarify.  If you requested that your care partner not be given the details of your procedure findings, then the procedure report has been included in a sealed envelope for you to review at your convenience later.  YOU SHOULD EXPECT: Some feelings of bloating in the abdomen. Passage of more gas than usual.  Walking can help get rid of the air that was put into your GI tract during the procedure and reduce the bloating. If you had a lower endoscopy (such as a colonoscopy or flexible sigmoidoscopy) you may notice spotting of blood in your stool or on the toilet paper. If you underwent a bowel prep for your procedure, then you may not have a normal bowel movement for a few days.  DIET: Your first meal following the procedure should be a light meal and then it is ok to progress to your normal diet.  A half-sandwich or bowl of soup is an example of a good first meal.  Heavy or fried foods are harder to digest and may make you feel nauseous or bloated.  Likewise meals heavy in dairy and vegetables can cause extra gas to form and this can also increase the bloating.  Drink plenty of fluids but you should avoid alcoholic beverages for 24 hours.  ACTIVITY: Your care partner should take you home directly after the procedure.  You should plan to take it easy, moving slowly for the rest of the day.  You can resume normal activity the day after the procedure however you should NOT DRIVE or use heavy machinery for 24 hours (because of the sedation medicines used during the test).    SYMPTOMS TO REPORT IMMEDIATELY: A gastroenterologist can be reached at any hour.  During normal business hours, 8:30 AM to 5:00 PM Monday through Friday,  call [phone].  After hours and on weekends, please call the GI answering service at [phone] who will take a message and have the physician on call contact you.   Following lower endoscopy (colonoscopy or flexible sigmoidoscopy):  Excessive amounts of blood in the stool  Significant tenderness or worsening of abdominal pains  Swelling of the abdomen that is new, acute  Fever of 100F or higher   Black, tarry-looking stools  FOLLOW UP: If any biopsies were taken you will be contacted by phone or by letter within the next 1-3 weeks.  Call your gastroenterologist if you have not heard about the biopsies in 3 weeks.    Poor prep, colonoscopy to be rescheduled.  Hemorrhoid information given.   Our staff will call the home number listed on your records the next business day following your procedure to check on you and address any questions or concerns that you may have at that time regarding the information given to you following your procedure. This is a courtesy call and so if there is no answer at the home number and we have not heard from you through the emergency physician on call, we will assume that you have returned to your regular daily activities without incident.  SIGNATURES/CONFIDENTIALITY: You and/or your care partner have signed paperwork which will be entered into your electronic medical record.  These signatures attest to the fact that that the information above  on your After Visit Summary has been reviewed and is understood.  Full responsibility of the confidentiality of this discharge information lies with you and/or your care-partner.

## 2012-10-09 NOTE — Progress Notes (Signed)
Pt. Colonoscopy rescheduled per Haynes Bast RN.  Instructions provided and prep ordered.

## 2012-10-09 NOTE — Progress Notes (Signed)
Tol well. Report to RN, VSS. No complaints voiced.

## 2012-10-12 ENCOUNTER — Telehealth: Payer: Self-pay | Admitting: *Deleted

## 2012-10-12 NOTE — Telephone Encounter (Signed)
Pt called at number left in admitting Friday and left message to call us back if questions, problems or concerns. ewm

## 2012-10-15 ENCOUNTER — Ambulatory Visit (AMBULATORY_SURGERY_CENTER): Payer: Medicare Other | Admitting: Internal Medicine

## 2012-10-15 ENCOUNTER — Encounter: Payer: Self-pay | Admitting: Internal Medicine

## 2012-10-15 VITALS — BP 127/59 | HR 55 | Temp 98.2°F | Resp 14 | Ht 61.5 in | Wt 145.0 lb

## 2012-10-15 DIAGNOSIS — R933 Abnormal findings on diagnostic imaging of other parts of digestive tract: Secondary | ICD-10-CM

## 2012-10-15 DIAGNOSIS — D126 Benign neoplasm of colon, unspecified: Secondary | ICD-10-CM

## 2012-10-15 DIAGNOSIS — R1031 Right lower quadrant pain: Secondary | ICD-10-CM

## 2012-10-15 DIAGNOSIS — K921 Melena: Secondary | ICD-10-CM

## 2012-10-15 DIAGNOSIS — F329 Major depressive disorder, single episode, unspecified: Secondary | ICD-10-CM | POA: Diagnosis not present

## 2012-10-15 DIAGNOSIS — I10 Essential (primary) hypertension: Secondary | ICD-10-CM | POA: Diagnosis not present

## 2012-10-15 DIAGNOSIS — R195 Other fecal abnormalities: Secondary | ICD-10-CM | POA: Diagnosis not present

## 2012-10-15 MED ORDER — SODIUM CHLORIDE 0.9 % IV SOLN: 500.0000 mL | INTRAVENOUS | Status: DC

## 2012-10-15 NOTE — Progress Notes (Signed)
Called to room to assist during endoscopic procedure.  Patient ID and intended procedure confirmed with present staff. Received instructions for my participation in the procedure from the performing physician.  

## 2012-10-15 NOTE — Progress Notes (Signed)
Report to pacu rn, vss, bbs=clear 

## 2012-10-15 NOTE — Op Note (Signed)
[facility] [address] [place], [address]   COLONOSCOPY PROCEDURE REPORT  PATIENT: Sherry, Wilkinson  MR#: [id number] BIRTHDATE: 03-Nov-1945 , 66  yrs. old GENDER: Female ENDOSCOPIST: Beverley Fiedler, MD REFERRED BY: Janit Pagan, MD PROCEDURE DATE:  10/15/2012 PROCEDURE:   Colonoscopy with snare polypectomy ASA CLASS:   Class II INDICATIONS:Rectal Bleeding, abdominal pain in the lower right quadrant, and an abnormal barium enema. MEDICATIONS: MAC sedation, administered by CRNA and propofol (Diprivan) 450mg  IV  DESCRIPTION OF PROCEDURE:   After the risks benefits and alternatives of the procedure were thoroughly explained, informed consent was obtained.  A digital rectal exam revealed no rectal mass.   The LB PFC-H190 [id number]  endoscope was introduced through the anus and advanced to the cecum, which was identified by both the appendix and ileocecal valve. No adverse events experienced. The quality of the prep was poor clearing to fair with copious irrigation and lavage, using Colyte 2 day prep.  The instrument was then slowly withdrawn as the colon was fully examined.    COLON FINDINGS: Poor preparation in the right colon clearing the fair with copious irrigation lavage despite two-day bowel preparation.  Two sessile polyps measuring 5 mm in size were found at the cecum.  Polypectomy was performed using cold snare.  All resections were complete and all polyp tissue was completely retrieved.   Two sessile polyps measuring 7-8 mm in size were found in the ascending colon.  Polypectomy was performed using hot snare. All resections were complete and all polyp tissue was completely retrieved.   Three sessile polyps measuring 4-6 mm in size were found in the ascending colon and at the hepatic flexure. Polypectomy was performed using cold snare.  All resections were complete and all polyp tissue was completely retrieved.   Mild diverticulosis was noted in the  sigmoid colon.   Small internal hemorrhoids were found.  Retroflexed views revealed internal hemorrhoids. The time to cecum=5 minutes 43 seconds.  Withdrawal time=27 minutes 31 seconds.  The scope was withdrawn and the procedure completed.  COMPLICATIONS: There were no complications.  ENDOSCOPIC IMPRESSION: 1.   Fair preparation after copious irrigation lavage, which decreases the sensitivity of colonoscopy for polyp detection. 2.   Two sessile polyps measuring 5 mm in size were found at the cecum; Polypectomy was performed using cold snare 3.   Two sessile polyps measuring 7-8 mm in size were found in the ascending colon; Polypectomy was performed using hot snare 4.   Three sessile polyps measuring 4-6 mm in size were found in the ascending colon and at the hepatic flexure; Polypectomy was performed using cold snare 5.   Mild diverticulosis was noted in the sigmoid colon 6.   Small internal hemorrhoids  RECOMMENDATIONS: 1.  Hold aspirin, aspirin products, and anti-inflammatory medication for 2 weeks. 2.  Await pathology results 3.  High fiber diet 4.  Repeat Colonoscopy in 1 year based on polyps removed today and preparation. 5.  You will receive a letter within 1-2 weeks with the results of your biopsy as well as final recommendations.  Please call my office if you have not received a letter after 3 weeks. 6.  CT scan of the abdomen and pelvis with contrast to further evaluate right lower quadrant pain not explained today by colonoscopy   eSigned:  Beverley Fiedler, MD 10/15/2012 3:32 PM   cc: The Patient; Janit Pagan, MD   PATIENT NAME:  Sherry, Wilkinson MR#: [id number]

## 2012-10-15 NOTE — Progress Notes (Addendum)
Patient did not experience any of the following events: a burn prior to discharge; a fall within the facility; wrong site/side/patient/procedure/implant event; or a hospital transfer or hospital admission upon discharge from the facility. (G8907)Patient did not have preoperative order for IV antibiotic SSI prophylaxis. [phone])  Pt did not pass any air while in recovery, pt stated she did not feel like she needed to pass any air, abd was soft non-tender, pt denies cramping or pain, pt went to restroom before leaving and did pass some air while in restroom, pt still denies pain or abd cramping, pt was advised how to pass gas at home-adm  Pt was given contrast and instruction sheet for CT scan, waiting for office to call with date of scan, advised pt how to fill out sheet once office calls-adm

## 2012-10-15 NOTE — Patient Instructions (Addendum)
YOU HAD AN ENDOSCOPIC PROCEDURE TODAY AT THE [facility]: Refer to the procedure report that was given to you for any specific questions about what was found during the examination.  If the procedure report does not answer your questions, please call your gastroenterologist to clarify.  If you requested that your care partner not be given the details of your procedure findings, then the procedure report has been included in a sealed envelope for you to review at your convenience later.  YOU SHOULD EXPECT: Some feelings of bloating in the abdomen. Passage of more gas than usual.  Walking can help get rid of the air that was put into your GI tract during the procedure and reduce the bloating. If you had a lower endoscopy (such as a colonoscopy or flexible sigmoidoscopy) you may notice spotting of blood in your stool or on the toilet paper. If you underwent a bowel prep for your procedure, then you may not have a normal bowel movement for a few days.  DIET: Your first meal following the procedure should be a light meal and then it is ok to progress to your normal diet.  A half-sandwich or bowl of soup is an example of a good first meal.  Heavy or fried foods are harder to digest and may make you feel nauseous or bloated.  Likewise meals heavy in dairy and vegetables can cause extra gas to form and this can also increase the bloating.  Drink plenty of fluids but you should avoid alcoholic beverages for 24 hours.  ACTIVITY: Your care partner should take you home directly after the procedure.  You should plan to take it easy, moving slowly for the rest of the day.  You can resume normal activity the day after the procedure however you should NOT DRIVE or use heavy machinery for 24 hours (because of the sedation medicines used during the test).    SYMPTOMS TO REPORT IMMEDIATELY: A gastroenterologist can be reached at any hour.  During normal business hours, 8:30 AM to 5:00 PM Monday through Friday,  call [phone].  After hours and on weekends, please call the GI answering service at [phone] who will take a message and have the physician on call contact you.   Following lower endoscopy (colonoscopy or flexible sigmoidoscopy):  Excessive amounts of blood in the stool  Significant tenderness or worsening of abdominal pains  Swelling of the abdomen that is new, acute  Fever of 100F or higher  FOLLOW UP: If any biopsies were taken you will be contacted by phone or by letter within the next 1-3 weeks.  Call your gastroenterologist if you have not heard about the biopsies in 3 weeks.  Our staff will call the home number listed on your records the next business day following your procedure to check on you and address any questions or concerns that you may have at that time regarding the information given to you following your procedure. This is a courtesy call and so if there is no answer at the home number and we have not heard from you through the emergency physician on call, we will assume that you have returned to your regular daily activities without incident.  SIGNATURES/CONFIDENTIALITY: You and/or your care partner have signed paperwork which will be entered into your electronic medical record.  These signatures attest to the fact that that the information above on your After Visit Summary has been reviewed and is understood.  Full responsibility of the confidentiality of this   discharge information lies with you and/or your care-partner.  Polyps, diverticulosis, high fiber diet, hemorrhoids-handouts given  Hold aspirin, aspirin products, and anti-inflammatory medications for 2 weeks (ibuprofen, motrin, advil, aleve, naproxen, etc) may have after 10/29/12  High fiber diet  Repeat colonoscopy in 1 year based on polyps  Ct scan of adb and pelvis with contrast to further evaluate R lower quadrant pain not explained by colonoscopy

## 2012-10-16 ENCOUNTER — Telehealth: Payer: Self-pay

## 2012-10-16 NOTE — Telephone Encounter (Signed)
  Follow up Call-  Call back number 10/15/2012 10/09/2012  Post procedure Call Back phone  # [phone]  Permission to leave phone message Yes Yes     Patient questions:  Do you have a fever, pain , or abdominal swelling? no Pain Score  0 *  Have you tolerated food without any problems? yes  Have you been able to return to your normal activities? yes  Do you have any questions about your discharge instructions: Diet   no Medications  no Follow up visit  no  Do you have questions or concerns about your Care? no  Actions: * If pain score is 4 or above: No action needed, pain <4.

## 2012-10-19 ENCOUNTER — Telehealth: Payer: Self-pay | Admitting: Internal Medicine

## 2012-10-19 DIAGNOSIS — K625 Hemorrhage of anus and rectum: Secondary | ICD-10-CM

## 2012-10-19 DIAGNOSIS — R933 Abnormal findings on diagnostic imaging of other parts of digestive tract: Secondary | ICD-10-CM

## 2012-10-19 DIAGNOSIS — R1031 Right lower quadrant pain: Secondary | ICD-10-CM

## 2012-10-19 NOTE — Telephone Encounter (Signed)
Patient called with abdominal pain. Wanted to know if she could take OTC pain medication, "like tylenol". Had colonoscopy last Thursday (reviewed - poor prep, diminutive polyps - cold snare). States pain began Friday and has been constant. No nausea, vomiting,fever,bleeding, etc... Eating fine with normal BM and uninterrupted sleep. Sound fine on the phone. Despite this, rates pain as "10 out of 10". She can't explain why she waited 3 days to call, and after hours, at that. She also asks "if I am supposed to have an MRI". Tells me she was having pain pre-procedure, but not sure if it's the same...Marland KitchenMarland KitchenMarland Kitchen I told her that I would forward this note to Dr. Rhea Belton and his nurse, and I recommended that she call Dr. Lauro Franklin nurse first thing in am for further direction.

## 2012-10-20 ENCOUNTER — Encounter: Payer: Self-pay | Admitting: Internal Medicine

## 2012-10-20 NOTE — Addendum Note (Signed)
Addended by: Florene Glen on: 10/20/2012 10:42 AM   Modules accepted: Orders

## 2012-10-20 NOTE — Telephone Encounter (Signed)
Would proceed to CT abd/pelvis to further eval abd pain.  She has had consistent RLQ abd pain for some time.  Further recommendations after CT.

## 2012-10-20 NOTE — Telephone Encounter (Signed)
Informed pt of CT scan scheduled for 10/22/12 at 10:30am; she already has her contrast. She will arrive at 09:00am and have labs drawn there d/t transportation issues. She will be NPO after 6:30am, drink constrast at 08:30 and at 09:30am. Pt stated understanding. She states the pain woke her this am at 5am on the r side above her waist. Explained that hopefully we will find the source of pain on the CT.

## 2012-10-20 NOTE — Addendum Note (Signed)
Addended by: Florene Glen on: 10/20/2012 08:58 AM   Modules accepted: Orders

## 2012-10-20 NOTE — Telephone Encounter (Signed)
CT scheduled for 10/22/12, but the pt needs labs and has to pick up the contrast. Tried to call her and the line is busy.

## 2012-10-22 ENCOUNTER — Ambulatory Visit: Payer: Medicare Other | Admitting: *Deleted

## 2012-10-22 ENCOUNTER — Ambulatory Visit (INDEPENDENT_AMBULATORY_CARE_PROVIDER_SITE_OTHER)
Admission: RE | Admit: 2012-10-22 | Discharge: 2012-10-22 | Disposition: A | Discharge status: Home / Self Care | Payer: Medicare Other | Source: Ambulatory Visit | Attending: Internal Medicine | Admitting: Internal Medicine

## 2012-10-22 DIAGNOSIS — R933 Abnormal findings on diagnostic imaging of other parts of digestive tract: Secondary | ICD-10-CM | POA: Diagnosis not present

## 2012-10-22 DIAGNOSIS — R1031 Right lower quadrant pain: Secondary | ICD-10-CM

## 2012-10-22 DIAGNOSIS — K625 Hemorrhage of anus and rectum: Secondary | ICD-10-CM

## 2012-10-22 DIAGNOSIS — K7689 Other specified diseases of liver: Secondary | ICD-10-CM | POA: Diagnosis not present

## 2012-10-22 LAB — HEPATIC FUNCTION PANEL
ALT: 9 U/L (ref 0–35)
AST: 15 U/L (ref 0–37)
Alkaline Phosphatase: 77 U/L (ref 39–117)
Bilirubin, Direct: 0.1 mg/dL (ref 0.0–0.3)
Indirect Bilirubin: 0.5 mg/dL (ref 0.0–0.9)
Total Bilirubin: 0.6 mg/dL (ref 0.3–1.2)

## 2012-10-22 LAB — BASIC METABOLIC PANEL
Chloride: 107 mEq/L (ref 96–112)
Potassium: 3.5 mEq/L (ref 3.5–5.3)
Sodium: 143 mEq/L (ref 135–145)

## 2012-10-22 MED ORDER — IOHEXOL 300 MG/ML  SOLN
100.0000 mL | Freq: Once | INTRAMUSCULAR | Status: AC | PRN
  Administered 2012-10-22: 100 mL via INTRAVENOUS

## 2012-10-26 ENCOUNTER — Telehealth: Payer: Self-pay | Admitting: *Deleted

## 2012-10-26 NOTE — Telephone Encounter (Signed)
Informed pt of normal labs and CT scan showing small fibroids; pt was very grateful nothing else was found. Mailed her copies of labs and CT scan. She refused pain meds and will f/u with [place] as needed.

## 2012-10-26 NOTE — Telephone Encounter (Signed)
Message copied by Florene Glen on Mon Oct 26, 2012  8:24 AM ------      Message from: Beverley Fiedler      Created: Sun Oct 25, 2012 11:31 AM       CT shows no acute findings and no explanation for right lower quadrant pain      She does have small uterine fibroids      Tramadol can be offered for the pain      If pain persists she should see her primary care doctor or could be seen by an advanced practitioner in our office ------

## 2013-02-01 ENCOUNTER — Encounter (HOSPITAL_COMMUNITY): Payer: Self-pay | Admitting: Emergency Medicine

## 2013-02-01 ENCOUNTER — Emergency Department (HOSPITAL_COMMUNITY)
Admission: EM | Admit: 2013-02-01 | Discharge: 2013-02-01 | Disposition: A | Discharge status: Home / Self Care | Payer: Medicare Other | Attending: Emergency Medicine | Admitting: Emergency Medicine

## 2013-02-01 DIAGNOSIS — N390 Urinary tract infection, site not specified: Secondary | ICD-10-CM

## 2013-02-01 DIAGNOSIS — R319 Hematuria, unspecified: Secondary | ICD-10-CM

## 2013-02-01 DIAGNOSIS — Z8719 Personal history of other diseases of the digestive system: Secondary | ICD-10-CM | POA: Diagnosis not present

## 2013-02-01 LAB — URINE MICROSCOPIC-ADD ON

## 2013-02-01 LAB — URINALYSIS, ROUTINE W REFLEX MICROSCOPIC
Bilirubin Urine: NEGATIVE
Glucose, UA: NEGATIVE mg/dL
Hgb urine dipstick: NEGATIVE
Ketones, ur: NEGATIVE mg/dL
Nitrite: NEGATIVE
Protein, ur: NEGATIVE mg/dL
Specific Gravity, Urine: 1.024 (ref 1.005–1.030)
Urobilinogen, UA: 0.2 mg/dL (ref 0.0–1.0)
pH: 5 (ref 5.0–8.0)

## 2013-02-01 MED ORDER — CIPROFLOXACIN HCL 500 MG PO TABS: 500.0000 mg | ORAL_TABLET | Freq: Two times a day (BID) | ORAL | Status: DC

## 2013-02-01 MED ORDER — CIPROFLOXACIN HCL 500 MG PO TABS
500.0000 mg | ORAL_TABLET | Freq: Once | ORAL | Status: AC
  Administered 2013-02-01: 500 mg via ORAL
  Filled 2013-02-01: qty 1

## 2013-02-01 NOTE — ED Notes (Signed)
To ED with c/o hematuria X2w, denies F/V/D, reports lower abd pain, denies dysuria, no relief with OTC meds, no other complaints, NAD

## 2013-02-01 NOTE — ED Notes (Signed)
Pt comfortable with d/c and f/u instructions. Prescriptions x1. Pt alert, pleasant, ambulatory from treatment room.

## 2013-02-01 NOTE — ED Provider Notes (Signed)
CSN: [id number]     Arrival date & time 02/01/13  1019 History   First MD Initiated Contact with Patient 02/01/13 1040     Chief Complaint  Patient presents with  . Hematuria   (Consider location/radiation/quality/duration/timing/severity/associated sxs/prior Treatment) HPI  67 year old female with intermittent hematuria and intermittent suprapubic pain. Onset about 2 weeks ago. Pain is crampy in nature. Does not radiate. It is not any significantly different when she actually urinates. Occasionally her urine has been orange in color to bright red. No fevers or chills. No nausea or vomiting. No unusual vaginal bleeding or discharge.  Past Medical History  Diagnosis Date  . Liver cyst   . Appendicitis    Past Surgical History  Procedure Laterality Date  . Opened tubes    . Appendectomy     Family History  Problem Relation Age of Onset  . Heart disease Mother   . Heart disease Maternal Grandmother   . Colon cancer Neg Hx   . Esophageal cancer Neg Hx   . Rectal cancer Neg Hx   . Stomach cancer Neg Hx    History  Substance Use Topics  . Smoking status: Never Smoker   . Smokeless tobacco: Never Used  . Alcohol Use: No   OB History   Grav Para Term Preterm Abortions TAB SAB Ect Mult Living   3 0 0 0 3 0 3        Review of Systems  All systems reviewed and negative, other than as noted in HPI.   Allergies  Review of patient's allergies indicates no known allergies.  Home Medications   Current Outpatient Rx  Name  Route  Sig  Dispense  Refill  . ibuprofen (ADVIL,MOTRIN) 200 MG tablet   Oral   Take 200 mg by mouth once as needed for pain.         . ciprofloxacin (CIPRO) 500 MG tablet   Oral   Take 1 tablet (500 mg total) by mouth every 12 (twelve) hours.   6 tablet   0    BP 120/65  Pulse 70  Temp(Src) 98.5 F (36.9 C) (Oral)  Resp 18  Ht 5\' 1"  (1.549 m)  Wt 135 lb (61.236 kg)  BMI 25.52 kg/m2  SpO2 100% Physical Exam  Nursing note and vitals  reviewed. Constitutional: She appears well-developed and well-nourished. No distress.  HENT:  Head: Normocephalic and atraumatic.  Eyes: Conjunctivae are normal. Right eye exhibits no discharge. Left eye exhibits no discharge.  Neck: Neck supple.  Cardiovascular: Normal rate, regular rhythm and normal heart sounds.  Exam reveals no gallop and no friction rub.   No murmur heard. Pulmonary/Chest: Effort normal and breath sounds normal. No respiratory distress.  Abdominal: Soft. She exhibits no distension. There is no tenderness.  Genitourinary:  No CVA tenderness  Musculoskeletal: She exhibits no edema and no tenderness.  Neurological: She is alert.  Skin: Skin is warm and dry.  Psychiatric: She has a normal mood and affect. Her behavior is normal. Thought content normal.    ED Course  Procedures (including critical care time) Labs Review Labs Reviewed  URINALYSIS, ROUTINE W REFLEX MICROSCOPIC - Abnormal; Notable for the following:    APPearance CLOUDY (*)    Leukocytes, UA MODERATE (*)    All other components within normal limits  URINE MICROSCOPIC-ADD ON - Abnormal; Notable for the following:    Squamous Epithelial / LPF MANY (*)    Bacteria, UA MANY (*)    All other components  within normal limits  URINE CULTURE   Imaging Review No results found.  EKG Interpretation   None       MDM   1. Hematuria   2. UTI (urinary tract infection)    67 year old female with intermittent hematuria and crampy lower abdominal pain. Suspect cystitis. Abdominal exam is benign. Afebrile and well appearing. No flank or back pain. No CVA tenderness. Culture sent. Will treat with diffuse is a breath this time. Return precautions were discussed as well as the need for outpatient followup, particularly if hematuria does not resolve after antibiotics.   Raeford Razor, MD 02/01/13 1151

## 2013-02-02 LAB — URINE CULTURE: Colony Count: 30000

## 2013-02-09 ENCOUNTER — Ambulatory Visit (INDEPENDENT_AMBULATORY_CARE_PROVIDER_SITE_OTHER): Payer: Medicare Other | Admitting: Family Medicine

## 2013-02-09 ENCOUNTER — Encounter: Payer: Self-pay | Admitting: Family Medicine

## 2013-02-09 VITALS — BP 110/70 | HR 72 | Temp 98.1°F | Ht 61.0 in | Wt 140.0 lb

## 2013-02-09 DIAGNOSIS — N390 Urinary tract infection, site not specified: Secondary | ICD-10-CM | POA: Diagnosis not present

## 2013-02-09 DIAGNOSIS — R82998 Other abnormal findings in urine: Secondary | ICD-10-CM

## 2013-02-09 LAB — POCT URINALYSIS DIPSTICK
Ketones, UA: NEGATIVE
Protein, UA: NEGATIVE
Spec Grav, UA: >=1.03
pH, UA: 5.5

## 2013-02-09 LAB — POCT UA - MICROSCOPIC ONLY: Epithelial cells, urine per micros: >20

## 2013-02-09 MED ORDER — FLUCONAZOLE 150 MG PO TABS: 150.0000 mg | ORAL_TABLET | Freq: Once | ORAL | Status: DC

## 2013-02-09 NOTE — Assessment & Plan Note (Signed)
Concern for UTI and hematuria. Urine dipstick significant for small leukocyte and occasional yeast. No blood in urine.Patient reassured. Urine discoloration might be due to poor hydration,increase hydration recommended. Due to small leukocyte and her symptoms urine was sent for culture. Diflucan X 1 dose for yeast in urine. F/U in 2 wks for reassessment.

## 2013-02-09 NOTE — Progress Notes (Signed)
Subjective:     Patient ID: Sherry Wilkinson, female   DOB: [birth year], 67 y.o.   MRN: [id number]  Urinary Tract Infection  This is a recurrent (First episode Feb 01 2013.) problem. Episode onset: Now having suprapubic pain which started yesterday. The problem occurs intermittently. The problem has been waxing and waning. The quality of the pain is described as aching. The pain is at a severity of 8/10. The pain is moderate. There has been no fever. There is no history of pyelonephritis. Pertinent negatives include no chills, discharge, flank pain, frequency, hesitancy, nausea, urgency or vomiting. She has tried nothing for the symptoms. There is no history of kidney stones or recurrent UTIs.   No current outpatient prescriptions on file prior to visit.   No current facility-administered medications on file prior to visit.   Past Medical History  Diagnosis Date  . Liver cyst   . Appendicitis      Review of Systems  Constitutional: Negative for chills.  Respiratory: Negative.   Cardiovascular: Negative.   Gastrointestinal: Negative.  Negative for nausea and vomiting.  Genitourinary: Negative for hesitancy, urgency, frequency and flank pain.       Dark urine  All other systems reviewed and are negative.   Filed Vitals:   02/09/13 1110  BP: 110/70  Pulse: 72  Temp: 98.1 F (36.7 C)  TempSrc: Oral  Height: 5\' 1"  (1.549 m)  Weight: 140 lb (63.504 kg)       Objective:   Physical Exam  Nursing note and vitals reviewed. Constitutional: She appears well-developed. No distress.  Cardiovascular: Normal rate, regular rhythm and normal heart sounds.   No murmur heard. Pulmonary/Chest: Effort normal and breath sounds normal. No respiratory distress. She has no wheezes. She exhibits no tenderness.  Abdominal: Soft. Bowel sounds are normal. She exhibits no distension and no mass. There is no tenderness.  No CVT  Musculoskeletal: Normal range of motion.       Assessment:     UTI:  Hospital follow up.  Change in urine color.    Plan:     Check problem list

## 2013-02-09 NOTE — Patient Instructions (Signed)
uti Urinary Tract Infection Urinary tract infections (UTIs) can develop anywhere along your urinary tract. Your urinary tract is your body's drainage system for removing wastes and extra water. Your urinary tract includes two kidneys, two ureters, a bladder, and a urethra. Your kidneys are a pair of bean-shaped organs. Each kidney is about the size of your fist. They are located below your ribs, one on each side of your spine. CAUSES Infections are caused by microbes, which are microscopic organisms, including fungi, viruses, and bacteria. These organisms are so small that they can only be seen through a microscope. Bacteria are the microbes that most commonly cause UTIs. SYMPTOMS  Symptoms of UTIs may vary by age and gender of the patient and by the location of the infection. Symptoms in young women typically include a frequent and intense urge to urinate and a painful, burning feeling in the bladder or urethra during urination. Older women and men are more likely to be tired, shaky, and weak and have muscle aches and abdominal pain. A fever may mean the infection is in your kidneys. Other symptoms of a kidney infection include pain in your back or sides below the ribs, nausea, and vomiting. DIAGNOSIS To diagnose a UTI, your caregiver will ask you about your symptoms. Your caregiver also will ask to provide a urine sample. The urine sample will be tested for bacteria and white blood cells. White blood cells are made by your body to help fight infection. TREATMENT  Typically, UTIs can be treated with medication. Because most UTIs are caused by a bacterial infection, they usually can be treated with the use of antibiotics. The choice of antibiotic and length of treatment depend on your symptoms and the type of bacteria causing your infection. HOME CARE INSTRUCTIONS  If you were prescribed antibiotics, take them exactly as your caregiver instructs you. Finish the medication even if you feel better after  you have only taken some of the medication.  Drink enough water and fluids to keep your urine clear or pale yellow.  Avoid caffeine, tea, and carbonated beverages. They tend to irritate your bladder.  Empty your bladder often. Avoid holding urine for long periods of time.  Empty your bladder before and after sexual intercourse.  After a bowel movement, women should cleanse from front to back. Use each tissue only once. SEEK MEDICAL CARE IF:   You have back pain.  You develop a fever.  Your symptoms do not begin to resolve within 3 days. SEEK IMMEDIATE MEDICAL CARE IF:   You have severe back pain or lower abdominal pain.  You develop chills.  You have nausea or vomiting.  You have continued burning or discomfort with urination. MAKE SURE YOU:   Understand these instructions.  Will watch your condition.  Will get help right away if you are not doing well or get worse. Document Released: 01/16/2005 Document Revised: 10/08/2011 Document Reviewed: 05/17/2011 [facility] Patient Information 2014 [place], [place].

## 2013-02-10 LAB — URINE CULTURE
Colony Count: NO GROWTH
Organism ID, Bacteria: NO GROWTH

## 2013-03-30 DIAGNOSIS — H43819 Vitreous degeneration, unspecified eye: Secondary | ICD-10-CM | POA: Diagnosis not present

## 2013-07-27 ENCOUNTER — Other Ambulatory Visit: Payer: Self-pay | Admitting: Family Medicine

## 2013-07-27 DIAGNOSIS — Z1231 Encounter for screening mammogram for malignant neoplasm of breast: Secondary | ICD-10-CM

## 2013-07-29 ENCOUNTER — Ambulatory Visit (HOSPITAL_COMMUNITY)
Admission: RE | Admit: 2013-07-29 | Discharge: 2013-07-29 | Disposition: A | Discharge status: Home / Self Care | Payer: Medicare Other | Source: Ambulatory Visit | Attending: Family Medicine | Admitting: Family Medicine

## 2013-07-29 DIAGNOSIS — Z1231 Encounter for screening mammogram for malignant neoplasm of breast: Secondary | ICD-10-CM | POA: Insufficient documentation

## 2013-09-20 ENCOUNTER — Encounter: Payer: Self-pay | Admitting: Internal Medicine

## 2013-09-22 ENCOUNTER — Ambulatory Visit (AMBULATORY_SURGERY_CENTER): Payer: Self-pay | Admitting: *Deleted

## 2013-09-22 ENCOUNTER — Telehealth: Payer: Self-pay | Admitting: *Deleted

## 2013-09-22 VITALS — Ht 61.5 in | Wt 140.2 lb

## 2013-09-22 DIAGNOSIS — Z86010 Personal history of colonic polyps: Secondary | ICD-10-CM

## 2013-09-22 MED ORDER — MOVIPREP 100 G PO SOLR: ORAL | Status: DC

## 2013-09-22 NOTE — Telephone Encounter (Signed)
Let's try our standard to day prep this time

## 2013-09-22 NOTE — Progress Notes (Signed)
No allergies to eggs or soy. No problems with anesthesia.  No oxygen use  No diet drug use  

## 2013-09-22 NOTE — Telephone Encounter (Signed)
noted 

## 2013-09-22 NOTE — Telephone Encounter (Signed)
Dr Hilarie Fredrickson: pt is scheduled for PV today at 1:30.  Recall colonoscopy scheduled for 6/18.  I see that she had colonoscopy 10/09/12 with MoviPep; poor prep.  Colonoscopy repeated 10/15/12 with Colytly 2 day prep with poor prep also.  What do you want her to do this time?  Thanks, Juliann Pulse

## 2013-10-07 ENCOUNTER — Encounter: Payer: Self-pay | Admitting: Internal Medicine

## 2013-10-07 ENCOUNTER — Ambulatory Visit (AMBULATORY_SURGERY_CENTER): Payer: Medicare Other | Admitting: Internal Medicine

## 2013-10-07 VITALS — BP 116/55 | HR 48 | Temp 97.0°F | Resp 14

## 2013-10-07 DIAGNOSIS — Z86010 Personal history of colonic polyps: Secondary | ICD-10-CM

## 2013-10-07 DIAGNOSIS — D126 Benign neoplasm of colon, unspecified: Secondary | ICD-10-CM

## 2013-10-07 DIAGNOSIS — Z1211 Encounter for screening for malignant neoplasm of colon: Secondary | ICD-10-CM

## 2013-10-07 DIAGNOSIS — E669 Obesity, unspecified: Secondary | ICD-10-CM | POA: Diagnosis not present

## 2013-10-07 MED ORDER — SODIUM CHLORIDE 0.9 % IV SOLN: 500.0000 mL | INTRAVENOUS | Status: DC

## 2013-10-07 NOTE — Patient Instructions (Signed)
YOU HAD AN ENDOSCOPIC PROCEDURE TODAY AT THE [facility]: Refer to the procedure report that was given to you for any specific questions about what was found during the examination.  If the procedure report does not answer your questions, please call your gastroenterologist to clarify.  If you requested that your care partner not be given the details of your procedure findings, then the procedure report has been included in a sealed envelope for you to review at your convenience later.  YOU SHOULD EXPECT: Some feelings of bloating in the abdomen. Passage of more gas than usual.  Walking can help get rid of the air that was put into your GI tract during the procedure and reduce the bloating. If you had a lower endoscopy (such as a colonoscopy or flexible sigmoidoscopy) you may notice spotting of blood in your stool or on the toilet paper. If you underwent a bowel prep for your procedure, then you may not have a normal bowel movement for a few days.  DIET: Your first meal following the procedure should be a light meal and then it is ok to progress to your normal diet.  A half-sandwich or bowl of soup is an example of a good first meal.  Heavy or fried foods are harder to digest and may make you feel nauseous or bloated.  Likewise meals heavy in dairy and vegetables can cause extra gas to form and this can also increase the bloating.  Drink plenty of fluids but you should avoid alcoholic beverages for 24 hours.  ACTIVITY: Your care partner should take you home directly after the procedure.  You should plan to take it easy, moving slowly for the rest of the day.  You can resume normal activity the day after the procedure however you should NOT DRIVE or use heavy machinery for 24 hours (because of the sedation medicines used during the test).    SYMPTOMS TO REPORT IMMEDIATELY: A gastroenterologist can be reached at any hour.  During normal business hours, 8:30 AM to 5:00 PM Monday through Friday,  call [phone].  After hours and on weekends, please call the GI answering service at [phone] who will take a message and have the physician on call contact you.   Following lower endoscopy (colonoscopy or flexible sigmoidoscopy):  Excessive amounts of blood in the stool  Significant tenderness or worsening of abdominal pains  Swelling of the abdomen that is new, acute  Fever of 100F or higher    FOLLOW UP: If any biopsies were taken you will be contacted by phone or by letter within the next 1-3 weeks.  Call your gastroenterologist if you have not heard about the biopsies in 3 weeks.  Our staff will call the home number listed on your records the next business day following your procedure to check on you and address any questions or concerns that you may have at that time regarding the information given to you following your procedure. This is a courtesy call and so if there is no answer at the home number and we have not heard from you through the emergency physician on call, we will assume that you have returned to your regular daily activities without incident.  SIGNATURES/CONFIDENTIALITY: You and/or your care partner have signed paperwork which will be entered into your electronic medical record.  These signatures attest to the fact that that the information above on your After Visit Summary has been reviewed and is understood.  Full responsibility of the confidentiality   of this discharge information lies with you and/or your care-partner.     

## 2013-10-07 NOTE — Progress Notes (Signed)
Report to PACU, RN, vss, BBS= Clear.  

## 2013-10-07 NOTE — Op Note (Signed)
[facility]  [address] [place], [id number]   COLONOSCOPY PROCEDURE REPORT  PATIENT: Sherry Wilkinson, Sherry Wilkinson  MR#: [id number] BIRTHDATE: 09/21/1945 , 67  yrs. old GENDER: Female ENDOSCOPIST: Jerene Bears, MD PROCEDURE DATE:  10/07/2013 PROCEDURE:   Colonoscopy with snare polypectomy and Colonoscopy with cold biopsy polypectomy First Screening Colonoscopy - Avg.  risk and is 50 yrs.  old or older - No.  Prior Negative Screening - Now for repeat screening. N/A  History of Adenoma - Now for follow-up colonoscopy & has been > or = to 3 yrs.  No.  It has been less than 3 yrs since last colonoscopy.  Medical reason.  Polyps Removed Today? Yes. ASA CLASS:   Class III INDICATIONS:elevated risk screening and Patient's personal history of adenomatous colon polyps, colonoscopy 1 year ago with multiple adenomas. MEDICATIONS: MAC sedation, administered by CRNA and propofol (Diprivan) 320mg  IV  DESCRIPTION OF PROCEDURE:   After the risks benefits and alternatives of the procedure were thoroughly explained, informed consent was obtained.  A digital rectal exam revealed no rectal mass.   The LB [id number]  endoscope was introduced through the anus and advanced to the cecum, which was identified by both the appendix and ileocecal valve. No adverse events experienced. The quality of the prep was Moviprep fair requiring copious irrigation and lavage.  The instrument was then slowly withdrawn as the colon was fully examined.   COLON FINDINGS: Three sessile polyps measuring 4-6 mm in size were found in the ascending colon and transverse colon.  Polypectomy was performed with cold forceps and using cold snare.  The resections were complete and the polyp tissue was partially retrieved. Retroflexed views revealed internal hemorrhoids. The time to cecum=3 minutes 47 seconds.  Withdrawal time=24 minutes 33 seconds. The scope was withdrawn and the procedure completed. COMPLICATIONS:  There were no complications.  ENDOSCOPIC IMPRESSION: Three sessile polyps measuring 4-6 mm in size were found in the ascending colon and transverse colon; Polypectomy was performed with cold forceps and using cold snare  RECOMMENDATIONS: 1.  Await pathology results 2.  Repeat Colonoscopy in 3 years. 3.  You will receive a letter within 1-2 weeks with the results of your biopsy as well as final recommendations.  Please call my office if you have not received a letter after 3 weeks.   eSigned:  Jerene Bears, MD 10/07/2013 11:21 AM  cc: The Patient; Andrena Mews, MD

## 2013-10-07 NOTE — Progress Notes (Signed)
Called to room to assist during endoscopic procedure.  Patient ID and intended procedure confirmed with present staff. Received instructions for my participation in the procedure from the performing physician.  

## 2013-10-11 ENCOUNTER — Telehealth: Payer: Self-pay

## 2013-10-11 NOTE — Telephone Encounter (Signed)
Left message on answering machine. 

## 2013-10-13 ENCOUNTER — Encounter: Payer: Self-pay | Admitting: Internal Medicine

## 2014-01-04 DIAGNOSIS — H2510 Age-related nuclear cataract, unspecified eye: Secondary | ICD-10-CM | POA: Diagnosis not present

## 2014-02-11 ENCOUNTER — Emergency Department (HOSPITAL_COMMUNITY)
Admission: EM | Admit: 2014-02-11 | Discharge: 2014-02-11 | Disposition: A | Discharge status: Home / Self Care | Payer: Medicare Other | Attending: Emergency Medicine | Admitting: Emergency Medicine

## 2014-02-11 ENCOUNTER — Encounter (HOSPITAL_COMMUNITY): Payer: Self-pay | Admitting: Emergency Medicine

## 2014-02-11 ENCOUNTER — Emergency Department (HOSPITAL_COMMUNITY): Payer: Medicare Other

## 2014-02-11 DIAGNOSIS — W19XXXA Unspecified fall, initial encounter: Secondary | ICD-10-CM

## 2014-02-11 DIAGNOSIS — S52511A Displaced fracture of right radial styloid process, initial encounter for closed fracture: Secondary | ICD-10-CM | POA: Diagnosis not present

## 2014-02-11 DIAGNOSIS — M25531 Pain in right wrist: Secondary | ICD-10-CM | POA: Insufficient documentation

## 2014-02-11 DIAGNOSIS — S52601A Unspecified fracture of lower end of right ulna, initial encounter for closed fracture: Secondary | ICD-10-CM | POA: Diagnosis not present

## 2014-02-11 DIAGNOSIS — W010XXA Fall on same level from slipping, tripping and stumbling without subsequent striking against object, initial encounter: Secondary | ICD-10-CM | POA: Diagnosis not present

## 2014-02-11 DIAGNOSIS — Z8719 Personal history of other diseases of the digestive system: Secondary | ICD-10-CM | POA: Insufficient documentation

## 2014-02-11 DIAGNOSIS — S6991XA Unspecified injury of right wrist, hand and finger(s), initial encounter: Secondary | ICD-10-CM | POA: Diagnosis present

## 2014-02-11 DIAGNOSIS — Y9301 Activity, walking, marching and hiking: Secondary | ICD-10-CM | POA: Insufficient documentation

## 2014-02-11 DIAGNOSIS — Y9289 Other specified places as the place of occurrence of the external cause: Secondary | ICD-10-CM | POA: Insufficient documentation

## 2014-02-11 DIAGNOSIS — S52501A Unspecified fracture of the lower end of right radius, initial encounter for closed fracture: Secondary | ICD-10-CM | POA: Diagnosis not present

## 2014-02-11 DIAGNOSIS — S52611A Displaced fracture of right ulna styloid process, initial encounter for closed fracture: Secondary | ICD-10-CM | POA: Diagnosis not present

## 2014-02-11 DIAGNOSIS — M25431 Effusion, right wrist: Secondary | ICD-10-CM | POA: Insufficient documentation

## 2014-02-11 MED ORDER — FENTANYL CITRATE 0.05 MG/ML IJ SOLN
50.0000 ug | Freq: Once | INTRAMUSCULAR | Status: AC
  Administered 2014-02-11: 50 ug via INTRAVENOUS

## 2014-02-11 MED ORDER — MORPHINE SULFATE 2 MG/ML IJ SOLN
4.0000 mg | Freq: Once | INTRAMUSCULAR | Status: DC
  Filled 2014-02-11: qty 2

## 2014-02-11 MED ORDER — FENTANYL CITRATE 0.05 MG/ML IJ SOLN
INTRAMUSCULAR | Status: DC
Start: 2014-02-11 — End: 2014-02-12
  Filled 2014-02-11: qty 2

## 2014-02-11 MED ORDER — OXYCODONE-ACETAMINOPHEN 5-325 MG PO TABS: 1.0000 | ORAL_TABLET | ORAL | Status: DC | PRN

## 2014-02-11 NOTE — ED Provider Notes (Signed)
CSN: [id number]     Arrival date & time 02/11/14  1946 History   First MD Initiated Contact with Patient 02/11/14 2045     Chief Complaint  Patient presents with  . Wrist Injury     (Consider location/radiation/quality/duration/timing/severity/associated sxs/prior Treatment) Patient is a 68 y.o. female presenting with wrist injury. The history is provided by the patient and medical records.  Wrist Injury  This is a 68 year old female with a significant past medical history presenting to the ED for right wrist injury. Patient states she was walking and slipped on an acorn causing her to fall. No head injury or LOC.  States she landed on her right wrist, with immediate onset of pain and swelling. She denies any numbness or paresthesias of her right hand or arm. She is right-hand dominant. No prior right wrist or hand injuries.  Last PO intake around 1300.  Patient denies any other injuries, no other complaints.  Past Medical History  Diagnosis Date  . Liver cyst   . Appendicitis    Past Surgical History  Procedure Laterality Date  . Uterine fibroid surgery    . Appendectomy  1970   Family History  Problem Relation Age of Onset  . Heart disease Mother   . Heart disease Maternal Grandmother   . Colon cancer Neg Hx   . Esophageal cancer Neg Hx   . Rectal cancer Neg Hx   . Stomach cancer Neg Hx    History  Substance Use Topics  . Smoking status: Never Smoker   . Smokeless tobacco: Never Used  . Alcohol Use: No   OB History   Grav Para Term Preterm Abortions TAB SAB Ect Mult Living   3 0 0 0 3 0 3        Review of Systems  Musculoskeletal: Positive for arthralgias and joint swelling.  All other systems reviewed and are negative.     Allergies  Review of patient's allergies indicates no known allergies.  Home Medications   Prior to Admission medications   Not on File   BP 128/62  Pulse 74  Temp(Src) 98.1 F (36.7 C) (Oral)  Resp 13  Ht 5\' 1"  (1.549 m)  Wt  140 lb (63.504 kg)  BMI 26.47 kg/m2  SpO2 100%  Physical Exam  Nursing note and vitals reviewed. Constitutional: She is oriented to person, place, and time. She appears well-developed and well-nourished. No distress.  HENT:  Head: Normocephalic and atraumatic.  Mouth/Throat: Oropharynx is clear and moist.  Eyes: Conjunctivae and EOM are normal. Pupils are equal, round, and reactive to light.  Neck: Normal range of motion. Neck supple.  Cardiovascular: Normal rate, regular rhythm and normal heart sounds.   Pulmonary/Chest: Effort normal and breath sounds normal. No respiratory distress. She has no wheezes.  Abdominal: Soft. Bowel sounds are normal. There is no tenderness. There is no guarding.  Musculoskeletal: Normal range of motion. She exhibits no edema.       Right wrist: She exhibits tenderness, bony tenderness and deformity.  Right wrist diffusely tender with + deformity; limited ROM secondary to pain;   Neurological: She is alert and oriented to person, place, and time.  Skin: Skin is warm and dry. She is not diaphoretic.  Psychiatric: She has a normal mood and affect.    ED Course  Procedures (including critical care time) Labs Review Labs Reviewed - No data to display  Imaging Review Dg Wrist Complete Right  02/11/2014   CLINICAL DATA:  Fall, right  wrist pain/deformity  EXAM: RIGHT WRIST - COMPLETE 3+ VIEW  COMPARISON:  None.  FINDINGS: Comminuted, impacted, intra-articular distal radial fracture. Mild dorsal displacement of the fracture fragments and carpus relative to the proximal radius.  Minimally displaced ulnar styloid fracture.  Associated soft tissue swelling.  IMPRESSION: Comminuted intra-articular distal radial fracture, as above.  Minimally displaced ulnar styloid fracture.   Electronically Signed   By: Julian Hy M.D.   On: 02/11/2014 21:08     EKG Interpretation None      MDM   Final diagnoses:  Distal radius fracture, right, closed, initial  encounter  Fracture of ulnar styloid, right, closed, initial encounter   68 year old female status post fall with right wrist deformity. Hand remains neurovascularly intact. X-ray confirms comminuted intra-articular distal radius fracture as well as ulnar styloid fracture.  Case was discussed with on-call hand surgery, Dr. Burney Gauze, who has reviewed films-- feels patient ok for splinting as is, office FU on Monday with likely surgical fixation on Wednesday.  Patient placed in sugar tong splint and arm sling.  She will FU as directed.  Percocet for pain control.  Discussed plan with patient, he/she acknowledged understanding and agreed with plan of care.  Return precautions given for new or worsening symptoms.  Larene Pickett, PA-C 02/11/14 2317

## 2014-02-11 NOTE — Discharge Instructions (Signed)
Take the prescribed medication as directed. Follow-up with Dr. Burney Gauze-- call office on Monday to schedule appt. Return to the ED for new or worsening symptoms.

## 2014-02-11 NOTE — ED Notes (Signed)
Presents with right wrist deformity and pain. Fingers warm to touch, brisk refill, strong radial pulse. Occurred from a fall.

## 2014-02-11 NOTE — ED Notes (Signed)
Ortho tech at bedside 

## 2014-02-11 NOTE — Progress Notes (Signed)
Orthopedic Tech Progress Note Patient Details:  Sherry Wilkinson 04-Feb-1946 [id number]  Ortho Devices Type of Ortho Device: Ace wrap;Sugartong splint;Arm sling Ortho Device/Splint Location: RUE Ortho Device/Splint Interventions: Ordered;Application   Braulio Bosch 02/11/2014, 10:23 PM

## 2014-02-11 NOTE — ED Notes (Signed)
PA at bedside.

## 2014-02-11 NOTE — ED Notes (Signed)
Paged ortho tech 

## 2014-02-12 NOTE — ED Provider Notes (Signed)
Medical screening examination/treatment/procedure(s) were conducted as a shared visit with non-physician practitioner(s) and myself.  I personally evaluated the patient during the encounter.   EKG Interpretation None      Patient here after a fall. Fell backwards on her wrist. No head injury, no LOC. R wrist with mild deformity. NVI. Xray has intra-articular fx. Splinted, will f/u with Dr. Burney Gauze with Hand Surgery.  Evelina Bucy, MD 02/12/14 [phone]

## 2014-02-15 DIAGNOSIS — S52611A Displaced fracture of right ulna styloid process, initial encounter for closed fracture: Secondary | ICD-10-CM | POA: Diagnosis not present

## 2014-02-15 DIAGNOSIS — S52531A Colles' fracture of right radius, initial encounter for closed fracture: Secondary | ICD-10-CM | POA: Diagnosis not present

## 2014-02-21 ENCOUNTER — Encounter (HOSPITAL_COMMUNITY): Payer: Self-pay | Admitting: Emergency Medicine

## 2014-02-21 DIAGNOSIS — S52611A Displaced fracture of right ulna styloid process, initial encounter for closed fracture: Secondary | ICD-10-CM | POA: Diagnosis not present

## 2014-02-21 DIAGNOSIS — S52531A Colles' fracture of right radius, initial encounter for closed fracture: Secondary | ICD-10-CM | POA: Diagnosis not present

## 2014-02-21 DIAGNOSIS — S52501A Unspecified fracture of the lower end of right radius, initial encounter for closed fracture: Secondary | ICD-10-CM | POA: Diagnosis not present

## 2014-02-21 DIAGNOSIS — Y929 Unspecified place or not applicable: Secondary | ICD-10-CM | POA: Diagnosis not present

## 2014-02-21 DIAGNOSIS — X58XXXA Exposure to other specified factors, initial encounter: Secondary | ICD-10-CM | POA: Diagnosis not present

## 2014-02-21 DIAGNOSIS — S52601A Unspecified fracture of lower end of right ulna, initial encounter for closed fracture: Secondary | ICD-10-CM | POA: Diagnosis not present

## 2014-02-21 DIAGNOSIS — G8918 Other acute postprocedural pain: Secondary | ICD-10-CM | POA: Diagnosis not present

## 2014-02-21 DIAGNOSIS — G5601 Carpal tunnel syndrome, right upper limb: Secondary | ICD-10-CM | POA: Diagnosis not present

## 2014-02-28 DIAGNOSIS — S52611D Displaced fracture of right ulna styloid process, subsequent encounter for closed fracture with routine healing: Secondary | ICD-10-CM | POA: Diagnosis not present

## 2014-02-28 DIAGNOSIS — S52531A Colles' fracture of right radius, initial encounter for closed fracture: Secondary | ICD-10-CM | POA: Diagnosis not present

## 2014-02-28 DIAGNOSIS — S52531D Colles' fracture of right radius, subsequent encounter for closed fracture with routine healing: Secondary | ICD-10-CM | POA: Diagnosis not present

## 2014-03-22 DIAGNOSIS — S52531D Colles' fracture of right radius, subsequent encounter for closed fracture with routine healing: Secondary | ICD-10-CM | POA: Diagnosis not present

## 2014-03-22 DIAGNOSIS — S52611D Displaced fracture of right ulna styloid process, subsequent encounter for closed fracture with routine healing: Secondary | ICD-10-CM | POA: Diagnosis not present

## 2014-03-28 DIAGNOSIS — S52611D Displaced fracture of right ulna styloid process, subsequent encounter for closed fracture with routine healing: Secondary | ICD-10-CM | POA: Diagnosis not present

## 2014-03-28 DIAGNOSIS — M25531 Pain in right wrist: Secondary | ICD-10-CM | POA: Diagnosis not present

## 2014-03-28 DIAGNOSIS — L905 Scar conditions and fibrosis of skin: Secondary | ICD-10-CM | POA: Diagnosis not present

## 2014-03-28 DIAGNOSIS — S52531D Colles' fracture of right radius, subsequent encounter for closed fracture with routine healing: Secondary | ICD-10-CM | POA: Diagnosis not present

## 2014-03-28 DIAGNOSIS — M25431 Effusion, right wrist: Secondary | ICD-10-CM | POA: Diagnosis not present

## 2014-03-28 DIAGNOSIS — M24541 Contracture, right hand: Secondary | ICD-10-CM | POA: Diagnosis not present

## 2014-03-28 DIAGNOSIS — R203 Hyperesthesia: Secondary | ICD-10-CM | POA: Diagnosis not present

## 2014-04-04 DIAGNOSIS — L905 Scar conditions and fibrosis of skin: Secondary | ICD-10-CM | POA: Diagnosis not present

## 2014-04-04 DIAGNOSIS — M25531 Pain in right wrist: Secondary | ICD-10-CM | POA: Diagnosis not present

## 2014-04-04 DIAGNOSIS — M24541 Contracture, right hand: Secondary | ICD-10-CM | POA: Diagnosis not present

## 2014-04-04 DIAGNOSIS — S52531D Colles' fracture of right radius, subsequent encounter for closed fracture with routine healing: Secondary | ICD-10-CM | POA: Diagnosis not present

## 2014-04-04 DIAGNOSIS — M25431 Effusion, right wrist: Secondary | ICD-10-CM | POA: Diagnosis not present

## 2014-04-04 DIAGNOSIS — R203 Hyperesthesia: Secondary | ICD-10-CM | POA: Diagnosis not present

## 2014-04-04 DIAGNOSIS — S52611D Displaced fracture of right ulna styloid process, subsequent encounter for closed fracture with routine healing: Secondary | ICD-10-CM | POA: Diagnosis not present

## 2014-04-11 DIAGNOSIS — S52611D Displaced fracture of right ulna styloid process, subsequent encounter for closed fracture with routine healing: Secondary | ICD-10-CM | POA: Diagnosis not present

## 2014-04-11 DIAGNOSIS — R203 Hyperesthesia: Secondary | ICD-10-CM | POA: Diagnosis not present

## 2014-04-11 DIAGNOSIS — S52531D Colles' fracture of right radius, subsequent encounter for closed fracture with routine healing: Secondary | ICD-10-CM | POA: Diagnosis not present

## 2014-04-11 DIAGNOSIS — M24541 Contracture, right hand: Secondary | ICD-10-CM | POA: Diagnosis not present

## 2014-04-19 DIAGNOSIS — M24541 Contracture, right hand: Secondary | ICD-10-CM | POA: Diagnosis not present

## 2014-04-19 DIAGNOSIS — R203 Hyperesthesia: Secondary | ICD-10-CM | POA: Diagnosis not present

## 2014-04-19 DIAGNOSIS — S52611D Displaced fracture of right ulna styloid process, subsequent encounter for closed fracture with routine healing: Secondary | ICD-10-CM | POA: Diagnosis not present

## 2014-04-19 DIAGNOSIS — S52531D Colles' fracture of right radius, subsequent encounter for closed fracture with routine healing: Secondary | ICD-10-CM | POA: Diagnosis not present

## 2014-05-10 ENCOUNTER — Other Ambulatory Visit: Payer: Self-pay | Admitting: Family Medicine

## 2014-05-10 DIAGNOSIS — Z1231 Encounter for screening mammogram for malignant neoplasm of breast: Secondary | ICD-10-CM

## 2014-05-13 ENCOUNTER — Ambulatory Visit: Payer: Medicare Other | Admitting: Family Medicine

## 2014-05-17 ENCOUNTER — Ambulatory Visit (INDEPENDENT_AMBULATORY_CARE_PROVIDER_SITE_OTHER): Payer: Medicare Other | Admitting: Family Medicine

## 2014-05-17 ENCOUNTER — Other Ambulatory Visit: Payer: Self-pay | Admitting: Family Medicine

## 2014-05-17 ENCOUNTER — Encounter: Payer: Self-pay | Admitting: Family Medicine

## 2014-05-17 VITALS — BP 138/85 | HR 77 | Temp 98.5°F | Ht 61.5 in | Wt 145.0 lb

## 2014-05-17 DIAGNOSIS — N6012 Diffuse cystic mastopathy of left breast: Secondary | ICD-10-CM

## 2014-05-17 DIAGNOSIS — S52531D Colles' fracture of right radius, subsequent encounter for closed fracture with routine healing: Secondary | ICD-10-CM | POA: Diagnosis not present

## 2014-05-17 DIAGNOSIS — S52611D Displaced fracture of right ulna styloid process, subsequent encounter for closed fracture with routine healing: Secondary | ICD-10-CM | POA: Diagnosis not present

## 2014-05-17 DIAGNOSIS — N644 Mastodynia: Secondary | ICD-10-CM

## 2014-05-17 NOTE — Patient Instructions (Signed)
Thank you for coming to the clinic today. It was nice seeing you.  For your breast pain, it is mostly likely a benign cyst that was diagnosed before. We will be getting an ultrasound to make sure. You can take over the counter tylenol as needed for pain. If your need something stronger, we can give you a medication called tramadol.

## 2014-05-17 NOTE — Progress Notes (Signed)
   Sherry Wilkinson is a 69 y.o. female who presents to the [facility] today with a chief complaint of breast pain. Her concerns today include:  HPI:  Left Breast Pain Started approximately 2 years ago. Was diagnosed with fibroglandular tissue 2 years ago in same area and has had normal screening mammogram 10 months ago. Says that pain has gradually worsened over the past couple years. Pain occurs all the time. Rated as 8/10. Has not tried any medications. No known alleviating or aggravating factors. No shortness of breath, no nausea or vomiting. No weight loss or night sweats. No fevers or chills.  ROS: As per HPI, otherwise all systems reviewed and are negative.  Past Medical History - Reviewed and updated Patient Active Problem List   Diagnosis Date Noted  . Breast pain, left 05/17/2014  . Dark urine 02/09/2013  . Liver cyst 08/04/2012  . Abdominal pain, right upper quadrant 08/04/2012  . Blood in stool 08/04/2012  . Breast cancer screening 08/04/2012  . RLQ abdominal pain 07/22/2012    Medications- reviewed and updated Current Outpatient Prescriptions  Medication Sig Dispense Refill  . oxyCODONE-acetaminophen (PERCOCET/ROXICET) 5-325 MG per tablet Take 1 tablet by mouth every 4 (four) hours as needed. 20 tablet 0   No current facility-administered medications for this visit.    Objective: Physical Exam: BP 138/85 mmHg  Pulse 77  Temp(Src) 98.5 F (36.9 C) (Oral)  Ht 5' 1.5" (1.562 m)  Wt 145 lb (65.772 kg)  BMI 26.96 kg/m2  Gen: NAD, resting comfortably CV: RRR with no murmurs appreciated Lungs: NWOB, CTAB with no crackles, wheezes, or rhonchi Abdomen: Normal bowel sounds present. Soft, Nontender, Nondistended. Breast: Fibroglandular tissue noted at approximately 9 o clock position of left breast. Tender to palpation. No other masses appreciated. No erythema.  Ext: no edema Skin: warm, dry Neuro: grossly normal, moves all extremities  A/P: See problem list  Breast pain,  left Breast pain is likely secondary to fibroglandular tissue, though cannot rule out malignancy strictly based on clinical exam. Will proceed with breast ultrasound and diagnostic mammography if indicated based on [place] results. Instructed patient to take OTC tylenol for pain and that we could try tramadol if her pain was not adequately responding to tylenol.     Algis Greenhouse. Jerline Pain, [place] Resident PGY-1 05/17/2014 5:26 PM

## 2014-05-17 NOTE — Assessment & Plan Note (Signed)
Breast pain is likely secondary to fibroglandular tissue, though cannot rule out malignancy strictly based on clinical exam. Will proceed with breast ultrasound and diagnostic mammography if indicated based on [place] results. Instructed patient to take OTC tylenol for pain and that we could try tramadol if her pain was not adequately responding to tylenol.

## 2014-05-24 ENCOUNTER — Ambulatory Visit
Admission: RE | Admit: 2014-05-24 | Discharge: 2014-05-24 | Disposition: A | Discharge status: Home / Self Care | Payer: Medicare Other | Source: Ambulatory Visit | Attending: Family Medicine | Admitting: Family Medicine

## 2014-05-24 DIAGNOSIS — N644 Mastodynia: Secondary | ICD-10-CM

## 2014-05-24 DIAGNOSIS — N6012 Diffuse cystic mastopathy of left breast: Secondary | ICD-10-CM

## 2014-08-02 ENCOUNTER — Ambulatory Visit (HOSPITAL_COMMUNITY): Payer: Medicare Other

## 2014-08-02 ENCOUNTER — Ambulatory Visit
Admission: RE | Admit: 2014-08-02 | Discharge: 2014-08-02 | Disposition: A | Discharge status: Home / Self Care | Payer: Medicare Other | Source: Ambulatory Visit | Attending: Family Medicine | Admitting: Family Medicine

## 2014-08-02 ENCOUNTER — Encounter: Payer: Self-pay | Admitting: Family Medicine

## 2014-08-02 DIAGNOSIS — Z1231 Encounter for screening mammogram for malignant neoplasm of breast: Secondary | ICD-10-CM

## 2014-10-28 DIAGNOSIS — H2513 Age-related nuclear cataract, bilateral: Secondary | ICD-10-CM | POA: Diagnosis not present

## 2014-12-13 ENCOUNTER — Encounter: Payer: Self-pay | Admitting: Family Medicine

## 2014-12-13 ENCOUNTER — Ambulatory Visit (INDEPENDENT_AMBULATORY_CARE_PROVIDER_SITE_OTHER): Payer: Medicare Other | Admitting: Family Medicine

## 2014-12-13 ENCOUNTER — Telehealth: Payer: Self-pay | Admitting: Family Medicine

## 2014-12-13 VITALS — BP 122/61 | HR 75 | Temp 98.3°F | Wt 144.0 lb

## 2014-12-13 DIAGNOSIS — H1011 Acute atopic conjunctivitis, right eye: Secondary | ICD-10-CM

## 2014-12-13 DIAGNOSIS — H1010 Acute atopic conjunctivitis, unspecified eye: Secondary | ICD-10-CM | POA: Insufficient documentation

## 2014-12-13 DIAGNOSIS — N750 Cyst of Bartholin's gland: Secondary | ICD-10-CM | POA: Diagnosis not present

## 2014-12-13 MED ORDER — OLOPATADINE HCL 0.2 % OP SOLN: 1.0000 [drp] | Freq: Every day | OPHTHALMIC | Status: DC

## 2014-12-13 NOTE — Patient Instructions (Signed)
It was nice seeing you today. It sounds like you have an eye allergy. I will send an eye drop to your pharmacy. You also have bartholin cyst of your vulva. I will check with Dr Nori Riis if she can excise it or refer you to OB/GYN. I will call you today with further instruction. See me soon if no improvement of your symptoms.

## 2014-12-13 NOTE — Progress Notes (Signed)
Subjective:     Patient ID: Sherry Wilkinson, female   DOB: [birth year], 69 y.o.   MRN: [id number]  Eye Problem  The right (C/O right eye occasional and intermittent swelling, no redness, feels like pressure but no pain and feels sand in her eye and at times it is pretty itchy. Symptoms improved now.) eye is affected. This is a new problem. The current episode started in the past 7 days (started 1 wk ago). The problem occurs intermittently. The problem has been waxing and waning. Injury mechanism: No injury, she does not wear contact lenses. She uses reading glasses. The pain is at a severity of 3/10 (Just some discomfort,not even a pain.). The patient is experiencing no pain. There is no known exposure to pink eye. She does not wear contacts. Associated symptoms include an eye discharge, a foreign body sensation and itching. Pertinent negatives include no blurred vision, double vision, eye redness, fever, nausea, photophobia or vomiting. Treatments tried: Warm compression with wash cloth. The treatment provided no relief.   Current Outpatient Prescriptions on File Prior to Visit  Medication Sig Dispense Refill  . oxyCODONE-acetaminophen (PERCOCET/ROXICET) 5-325 MG per tablet Take 1 tablet by mouth every 4 (four) hours as needed. (Patient not taking: Reported on 12/13/2014) 20 tablet 0   No current facility-administered medications on file prior to visit.   Past Medical History  Diagnosis Date  . Liver cyst   . Appendicitis      Review of Systems  Constitutional: Negative for fever.  Eyes: Positive for discharge. Negative for blurred vision, double vision, photophobia and redness.  Respiratory: Negative.   Cardiovascular: Negative.   Gastrointestinal: Negative.  Negative for nausea and vomiting.  Genitourinary:       Painful bump on her vulva  Skin: Positive for itching.  All other systems reviewed and are negative.  Filed Vitals:   12/13/14 0903  BP: 122/61  Pulse: 75  Temp: 98.3 F  (36.8 C)  TempSrc: Oral  Weight: 144 lb (65.318 kg)        Objective:   Physical Exam  Constitutional: She appears well-developed. No distress.  Eyes: Conjunctivae, EOM and lids are normal. Pupils are equal, round, and reactive to light. Lids are everted and swept, no foreign bodies found. Right eye exhibits no chemosis, no discharge and no exudate. Left eye exhibits no chemosis, no discharge and no exudate. No scleral icterus.  Fundoscopic exam:      The right eye shows no hemorrhage and no papilledema.       The left eye shows no hemorrhage and no papilledema.  Cardiovascular: Normal rate, regular rhythm, normal heart sounds and intact distal pulses.   No murmur heard. Pulmonary/Chest: Effort normal and breath sounds normal. No respiratory distress. She has no wheezes.  Genitourinary:     Nursing note and vitals reviewed.      Assessment:     Allergic conjunctivitis Bartholin cyst.     Plan:     Check problem list.

## 2014-12-13 NOTE — Telephone Encounter (Signed)
I discussed with Dr Nori Riis, she is willing to do Marsupialization for patient tomorrow for her bartholin cyst. I called patient to schedule this twice, message left on both occasions. She is advised to call back for further information.

## 2014-12-13 NOTE — Assessment & Plan Note (Signed)
Small and symptomatic. I recommended conservative measures vs incision and drainage. As discussed with her I will speak with Dr Nori Riis who does Marsupialization. I will call her to reschedule appointment for this. In the mean time may use sitz bath and tylenol prn pain.

## 2014-12-13 NOTE — Assessment & Plan Note (Signed)
Currently asymptomatic. Patient prescribed Pataday. If no improvement she is advised to contact me. I will refer to ophthalmologist if no improvement for further assessment. She agreed with plan.

## 2014-12-14 ENCOUNTER — Ambulatory Visit (INDEPENDENT_AMBULATORY_CARE_PROVIDER_SITE_OTHER): Payer: Medicare Other | Admitting: Family Medicine

## 2014-12-14 ENCOUNTER — Encounter: Payer: Self-pay | Admitting: Family Medicine

## 2014-12-14 VITALS — BP 126/71 | HR 68 | Temp 98.6°F | Ht 61.5 in | Wt 143.0 lb

## 2014-12-14 DIAGNOSIS — L02215 Cutaneous abscess of perineum: Secondary | ICD-10-CM

## 2014-12-14 MED ORDER — OXYCODONE-ACETAMINOPHEN 5-325 MG PO TABS: 1.0000 | ORAL_TABLET | ORAL | Status: DC | PRN

## 2014-12-14 MED ORDER — DOXYCYCLINE HYCLATE 100 MG PO TABS: 100.0000 mg | ORAL_TABLET | Freq: Two times a day (BID) | ORAL | Status: DC

## 2014-12-14 NOTE — Patient Instructions (Signed)
  It was nice seeing you today. We were not able to get a whole lot from the abscess on your vulva. I will recommend you stay on Doxycycline for 7 days as instructed and use sitz bath for relieve. I will also give you percocet for 7 days. Please see me back in the office this Friday for reassessment. I hope you start to feel better soon.  Sitz Bath A sitz bath is a warm water bath taken in the sitting position. The water covers only the hips and butt (buttocks). It may be used for either healing or cleaning purposes. Sitz baths are also used to relieve pain, itching, or muscle tightening (spasms). The water may contain medicine. Moist heat will help you heal and relax.  HOME CARE  Take 3 to 4 sitz baths a day. 1. Fill the bathtub half-full with warm water. 2. Sit in the water and open the drain a little. 3. Turn on the warm water to keep the tub half-full. Keep the water running constantly. 4. Soak in the water for 15 to 20 minutes. 5. After the sitz bath, pat the affected area dry. GET HELP RIGHT AWAY IF: You get worse instead of better. Stop the sitz baths if you get worse. MAKE SURE YOU:  Understand these instructions.  Will watch your condition.  Will get help right away if you are not doing well or get worse. Document Released: 05/16/2004 Document Revised: 01/01/2012 Document Reviewed: 08/06/2010 [facility] Patient Information 2015 [place], [place]. This information is not intended to replace advice given to you by your health care provider. Make sure you discuss any questions you have with your health care provider.

## 2014-12-14 NOTE — Progress Notes (Signed)
Patient ID: Sherry Wilkinson, female   DOB: 04-14-1946, 69 y.o.   MRN: [id number] Incision and Drainage Procedure Note  Pre-operative Diagnosis: Bartholin cyst/perinieum abscess.  Post-operative Diagnosis: Perineum abscess  Indications: Pain and swelling Physical Exam  Genitourinary:        Anesthesia: 2% plain lidocaine  Procedure Details  The procedure, risks and complications have been discussed in detail (including, but not limited to airway compromise, infection, bleeding) with the patient, and the patient has signed consent to the procedure.  The skin was sterilely prepped and draped over the affected area in the usual fashion. After adequate local anesthesia, I&D with a #10 blade was performed on the right perineal area closer to the labia majora, with a small,stab incision. Purulent drainage: absent,mostly bloody liquid. The patient was observed until stable.  Procedure performed with Dr Nori Riis.  Findings: Absent purulent drainage, mostly blood liquid.  EBL: 1 cc's  Drains: N/A  Condition: Tolerated procedure well, Stable and Wound care instruction given. F/U in 2 days for reassessment.   Complications: none.

## 2014-12-16 ENCOUNTER — Ambulatory Visit (INDEPENDENT_AMBULATORY_CARE_PROVIDER_SITE_OTHER): Payer: Medicare Other | Admitting: Family Medicine

## 2014-12-16 ENCOUNTER — Encounter: Payer: Self-pay | Admitting: Family Medicine

## 2014-12-16 VITALS — BP 118/65 | HR 78 | Temp 98.3°F | Ht 61.5 in | Wt 145.2 lb

## 2014-12-16 DIAGNOSIS — L02215 Cutaneous abscess of perineum: Secondary | ICD-10-CM | POA: Insufficient documentation

## 2014-12-16 HISTORY — DX: Cutaneous abscess of perineum: L02.215

## 2014-12-16 NOTE — Assessment & Plan Note (Signed)
Rapidly resolving s/p I&D and currently on Doxycycline. Advised to complete A/B. Return soon if there is reoccurrence. F/U as needed

## 2014-12-16 NOTE — Progress Notes (Signed)
Subjective:     Patient ID: Sherry Wilkinson, female   DOB: [birth year], 69 y.o.   MRN: [id number]  HPI  Perineal abscess: She is here for follow up after I&D. She is still on Doxy. She does not need to take her percocet so often. Feeling better with less pain and discomfort.  Current Outpatient Prescriptions on File Prior to Visit  Medication Sig Dispense Refill  . doxycycline (VIBRA-TABS) 100 MG tablet Take 1 tablet (100 mg total) by mouth 2 (two) times daily. 14 tablet 0  . Olopatadine HCl 0.2 % SOLN Apply 1 drop to eye daily. 2.5 mL 1  . oxyCODONE-acetaminophen (PERCOCET/ROXICET) 5-325 MG per tablet Take 1 tablet by mouth every 4 (four) hours as needed. 15 tablet 0   No current facility-administered medications on file prior to visit.   Past Medical History  Diagnosis Date  . Liver cyst   . Appendicitis       Review of Systems  Respiratory: Negative.   Cardiovascular: Negative.   Genitourinary: Negative.   All other systems reviewed and are negative.  Filed Vitals:   12/16/14 1413  BP: 118/65  Pulse: 78  Temp: 98.3 F (36.8 C)  TempSrc: Oral  Height: 5' 1.5" (1.562 m)  Weight: 145 lb 4 oz (65.885 kg)       Objective:   Physical Exam  Constitutional: She appears well-developed.  Cardiovascular: Normal rate, regular rhythm and normal heart sounds.   No murmur heard. Pulmonary/Chest: Effort normal and breath sounds normal. No respiratory distress. She has no wheezes.  Genitourinary:     Vitals reviewed.      Assessment:     Perineal ascess     Plan:     Check problem list.

## 2014-12-16 NOTE — Patient Instructions (Signed)
It was nice seeing your. It seems the antibiotic for your abscess is working. The perineal swelling has gone down. Please complete your antibiotic treatment. See me as needed.

## 2015-06-20 ENCOUNTER — Telehealth: Payer: Self-pay | Admitting: *Deleted

## 2015-06-20 NOTE — Telephone Encounter (Signed)
Called patient to offer flu vaccine, however, VM picked up.  Left message on patient's voice mail to return call. Tequisha Maahs L, RN   

## 2015-06-21 NOTE — Telephone Encounter (Signed)
hasnt had flu shot and doesn't want one

## 2015-06-26 ENCOUNTER — Other Ambulatory Visit: Payer: Self-pay

## 2015-06-26 DIAGNOSIS — Z1231 Encounter for screening mammogram for malignant neoplasm of breast: Secondary | ICD-10-CM

## 2015-08-03 ENCOUNTER — Ambulatory Visit
Admission: RE | Admit: 2015-08-03 | Discharge: 2015-08-03 | Disposition: A | Discharge status: Home / Self Care | Payer: Medicare Other | Source: Ambulatory Visit

## 2015-08-03 DIAGNOSIS — Z1231 Encounter for screening mammogram for malignant neoplasm of breast: Secondary | ICD-10-CM | POA: Diagnosis not present

## 2015-08-07 ENCOUNTER — Encounter (HOSPITAL_COMMUNITY): Payer: Self-pay | Admitting: Family Medicine

## 2015-08-24 DIAGNOSIS — H2513 Age-related nuclear cataract, bilateral: Secondary | ICD-10-CM | POA: Diagnosis not present

## 2015-10-17 ENCOUNTER — Ambulatory Visit (INDEPENDENT_AMBULATORY_CARE_PROVIDER_SITE_OTHER): Payer: Medicare Other | Admitting: Family Medicine

## 2015-10-17 ENCOUNTER — Encounter: Payer: Self-pay | Admitting: Family Medicine

## 2015-10-17 VITALS — BP 122/69 | HR 71 | Temp 98.5°F | Ht 62.0 in | Wt 153.0 lb

## 2015-10-17 DIAGNOSIS — Z0189 Encounter for other specified special examinations: Secondary | ICD-10-CM

## 2015-10-17 DIAGNOSIS — E2839 Other primary ovarian failure: Secondary | ICD-10-CM

## 2015-10-17 DIAGNOSIS — Z1159 Encounter for screening for other viral diseases: Secondary | ICD-10-CM

## 2015-10-17 DIAGNOSIS — E785 Hyperlipidemia, unspecified: Secondary | ICD-10-CM | POA: Diagnosis not present

## 2015-10-17 DIAGNOSIS — Z13220 Encounter for screening for lipoid disorders: Secondary | ICD-10-CM | POA: Diagnosis not present

## 2015-10-17 DIAGNOSIS — K7689 Other specified diseases of liver: Secondary | ICD-10-CM | POA: Diagnosis not present

## 2015-10-17 DIAGNOSIS — Z0000 Encounter for general adult medical examination without abnormal findings: Secondary | ICD-10-CM

## 2015-10-17 LAB — LIPID PANEL
CHOLESTEROL: 175 mg/dL (ref 125–200)
HDL: 63 mg/dL (ref >=46)
LDL Cholesterol: 82 mg/dL (ref <130)
Total CHOL/HDL Ratio: 2.8 Ratio (ref <=5.0)
Triglycerides: 151 mg/dL — ABNORMAL HIGH (ref <150)
VLDL: 30 mg/dL (ref <30)

## 2015-10-17 LAB — COMPLETE METABOLIC PANEL WITH GFR
ALK PHOS: 76 U/L (ref 33–130)
ALT: 13 U/L (ref 6–29)
AST: 20 U/L (ref 10–35)
Albumin: 4.2 g/dL (ref 3.6–5.1)
BILIRUBIN TOTAL: 0.4 mg/dL (ref 0.2–1.2)
BUN: 6 mg/dL — ABNORMAL LOW (ref 7–25)
CALCIUM: 9.1 mg/dL (ref 8.6–10.4)
CHLORIDE: 107 mmol/L (ref 98–110)
CO2: 26 mmol/L (ref 20–31)
CREATININE: 0.91 mg/dL (ref 0.60–0.93)
GFR, EST AFRICAN AMERICAN: 74 mL/min (ref >=60)
GFR, Est Non African American: 64 mL/min (ref >=60)
Glucose, Bld: 107 mg/dL — ABNORMAL HIGH (ref 65–99)
Potassium: 4.2 mmol/L (ref 3.5–5.3)
Sodium: 141 mmol/L (ref 135–146)
TOTAL PROTEIN: 6.9 g/dL (ref 6.1–8.1)

## 2015-10-17 NOTE — Progress Notes (Signed)
Subjective:     Sherry Wilkinson is a 70 y.o. female and is here for a comprehensive physical exam. The patient reports problems - check liver cyst and vulva cyst. Felt a bump in her vulva. She denies any vulva pain but she is aware of it..  Social History   Social History  . Marital Status: Divorced    Spouse Name: N/A  . Number of Children: N/A  . Years of Education: N/A   Occupational History  . Not on file.   Social History Main Topics  . Smoking status: Never Smoker   . Smokeless tobacco: Never Used  . Alcohol Use: No  . Drug Use: No  . Sexual Activity: No   Other Topics Concern  . Not on file   Social History Narrative   Health Maintenance  Topic Date Due  . Hepatitis C Screening  May 22, 1945  . TETANUS/TDAP  04/09/1964  . ZOSTAVAX  04/09/2005  . DEXA SCAN  04/09/2010  . PNA vac Low Risk Adult (1 of 2 - PCV13) 10/21/2016 (Originally 04/09/2010)  . INFLUENZA VACCINE  11/21/2015  . COLONOSCOPY  10/07/2016  . MAMMOGRAM  08/02/2017    The following portions of the patient's history were reviewed and updated as appropriate: allergies, current medications, past family history, past medical history, past social history, past surgical history and problem list.  Review of Systems Pertinent items are noted in HPI.   Objective:    BP 122/69 mmHg  Pulse 71  Temp(Src) 98.5 F (36.9 C) (Oral)  Ht 5\' 2"  (1.575 m)  Wt 153 lb (69.4 kg)  BMI 27.98 kg/m2 General appearance: alert and cooperative Head: Normocephalic, without obvious abnormality, atraumatic Eyes: conjunctivae/corneas clear. PERRL, EOM's intact. Fundi benign. Ears: normal TM's and external ear canals both ears Throat: lips, mucosa, and tongue normal; teeth and gums normal Neck: no adenopathy, no carotid bruit, no JVD, supple, symmetrical, trachea midline and thyroid not enlarged, symmetric, no tenderness/mass/nodules Lungs: clear to auscultation bilaterally Breasts: deferred. Normal mammogram 2 months ago.  no new breast concern Heart: regular rate and rhythm, S1, S2 normal, no murmur, click, rub or gallop Abdomen: soft, non-tender; bowel sounds normal; no masses,  no organomegaly Pelvic: vulva appears normal. small pinpoint lesion/ ingrown hair on mons pubis.Otherwise normal exam Extremities: extremities normal, atraumatic, no cyanosis or edema Skin: Skin color, texture, turgor normal. No rashes or lesions Lymph nodes: Cervical, supraclavicular, and axillary nodes normal. Neurologic: Alert and oriented X 3, normal strength and tone. Normal symmetric reflexes. Normal coordination and gait    Assessment:    Healthy female exam. Normal physical exam in general.      Plan:     Physical exam age appropriate. Vaccination recommended today but she declined PNV, Tdap or Zostavax. She is up to date with PAP and mammogram and colonoscopy. Dexa scan recommended and she agreed with it. Hep C screening completed today. CT abdomen done in 2014 shows liver cyst likely benign. Cmet ordered today. Bump on her mons pubis likely ingrown hair. Patient reassured this is benign. Since it does not bother her we will keep an eye on it. Return precaution discussed. FLP completed today.  Return for medicare annual wellness exam.

## 2015-10-17 NOTE — Patient Instructions (Signed)
It was nice seeing you today. You physical exam went well. We will obtain Liver test for your liver cyst. Your pelvic exam looks fine as well. Please see me back soon if you have any question. We will obtain some lab today and I will call you back soon with result.

## 2015-10-18 ENCOUNTER — Telehealth: Payer: Self-pay | Admitting: *Deleted

## 2015-10-18 ENCOUNTER — Telehealth: Payer: Self-pay | Admitting: Family Medicine

## 2015-10-18 LAB — HEPATITIS C ANTIBODY: HCV Ab: NEGATIVE

## 2015-10-18 NOTE — Telephone Encounter (Signed)
I called her back. Result discussed with her. I also reminded her to call [facility] imaging to schedule Dexa scan. She verbalized understanding.

## 2015-10-18 NOTE — Telephone Encounter (Signed)
Was unable to leave a message. In general, lab result looks fine.

## 2015-10-18 NOTE — Telephone Encounter (Signed)
-----   Message from Kinnie Feil, MD sent at 10/17/2015  4:08 PM EDT ----- Please help schedule dexa scan

## 2015-10-18 NOTE — Telephone Encounter (Signed)
LM for patient to call back.  She can actually schedule her own appt for this bone density scan.  The breast center's number is [phone]. Jazmin Hartsell,CMA

## 2015-10-19 NOTE — Addendum Note (Signed)
Addended by: Valerie Roys on: 10/19/2015 11:48 AM   Modules accepted: Orders

## 2015-10-19 NOTE — Telephone Encounter (Signed)
Pt called the breast center and was told there were no orders for her bone density scan. Please advis

## 2015-10-19 NOTE — Telephone Encounter (Signed)
Hmmm, i thought I placed a DG order? I will review. Thanks.

## 2015-10-19 NOTE — Telephone Encounter (Signed)
Ok I fixed it

## 2015-10-19 NOTE — Telephone Encounter (Signed)
Will forward to MD.  Order will need to be changed to [facility]

## 2015-10-19 NOTE — Telephone Encounter (Signed)
DG bone density ordered pended.  Jazmin Hartsell,CMA

## 2015-10-19 NOTE — Telephone Encounter (Signed)
Patient is aware and appt scheduled for 11-01-15 at [facility]

## 2015-10-19 NOTE — Addendum Note (Signed)
Addended by: Valerie Roys on: 10/19/2015 11:23 AM   Modules accepted: Orders

## 2015-10-19 NOTE — Addendum Note (Signed)
Addended by: Andrena Mews T on: 10/19/2015 11:32 AM   Modules accepted: Orders, SmartSet

## 2015-10-31 ENCOUNTER — Ambulatory Visit: Payer: Medicare Other | Admitting: *Deleted

## 2015-11-01 ENCOUNTER — Inpatient Hospital Stay: Admission: RE | Admit: 2015-11-01 | Payer: Medicare Other | Source: Ambulatory Visit

## 2015-11-01 ENCOUNTER — Telehealth: Payer: Self-pay | Admitting: *Deleted

## 2015-11-01 NOTE — Telephone Encounter (Signed)
Pt came up the office before we could call her back. Spoke with pt and she states she was confused thought she was supposed to be getting an MRI? Pt states someone from the breast center kept calling her to confirm her apt. I told pt she had a DEXA scan scheduled and that was why the breast center was calling her. Pt either no showed that apt yesterday or canceled it. Just making sure, is pt supposed to be getting an MRI? Please advise. Page. Cma.

## 2015-11-01 NOTE — Telephone Encounter (Signed)
She was meant to get Dexa scan. I did not order any MRI.

## 2015-11-01 NOTE — Telephone Encounter (Signed)
Patient insistent that she was supposed to be having an MRI today. Informed patient that she was scheduled for a Dexa scan this morning at the breast Center. But patient states that Jazmin told her she would be having an MRI today and she doesn't know anything about a Dexa scan. Please call patient.

## 2015-11-02 ENCOUNTER — Encounter: Payer: Self-pay | Admitting: *Deleted

## 2015-11-02 ENCOUNTER — Ambulatory Visit (INDEPENDENT_AMBULATORY_CARE_PROVIDER_SITE_OTHER): Payer: Medicare Other | Admitting: *Deleted

## 2015-11-02 VITALS — BP 112/63 | HR 60 | Temp 98.1°F | Ht 61.5 in | Wt 151.6 lb

## 2015-11-02 DIAGNOSIS — Z0000 Encounter for general adult medical examination without abnormal findings: Secondary | ICD-10-CM | POA: Diagnosis not present

## 2015-11-02 NOTE — Progress Notes (Signed)
Subjective:   Sherry Wilkinson is a 70 y.o. female who presents for an Initial Medicare Annual Wellness Visit.   Cardiac Risk Factors include: advanced age (>47men, >32 women)     Objective:    Today's Vitals   11/02/15 0915  BP: 112/63  Pulse: 60  Temp: 98.1 F (36.7 C)  TempSrc: Oral  Height: 5' 1.5" (1.562 m)  Weight: 151 lb 9.6 oz (68.765 kg)  SpO2: 100%   Body mass index is 28.18 kg/(m^2).   Current Medications (verified) No outpatient encounter prescriptions on file as of 11/02/2015.   No facility-administered encounter medications on file as of 11/02/2015.    Allergies (verified) Review of patient's allergies indicates no known allergies.   History: Past Medical History  Diagnosis Date  . Liver cyst   . Appendicitis    Past Surgical History  Procedure Laterality Date  . Uterine fibroid surgery    . Appendectomy  1970   Family History  Problem Relation Age of Onset  . Heart disease Mother   . Heart disease Maternal Grandmother   . Colon cancer Neg Hx   . Esophageal cancer Neg Hx   . Rectal cancer Neg Hx   . Stomach cancer Neg Hx    Social History   Occupational History  . Not on file.   Social History Main Topics  . Smoking status: Never Smoker   . Smokeless tobacco: Never Used  . Alcohol Use: No  . Drug Use: No  . Sexual Activity: No    Tobacco Counseling Counseling given: Yes   Activities of Daily Living In your present state of health, do you have any difficulty performing the following activities: 11/02/2015  Hearing? N  Vision? N  Difficulty concentrating or making decisions? Y  Walking or climbing stairs? N  Dressing or bathing? N  Doing errands, shopping? N  Preparing Food and eating ? N  Using the Toilet? N  In the past six months, have you accidently leaked urine? N  Do you have problems with loss of bowel control? N  Managing your Medications? N  Managing your Finances? N  Housekeeping or managing your Housekeeping?  N   Home Safety:  My home has a working smoke alarm:  Yes           My home throw rugs have been fastened down to the floor or removed:  No. Patient sadvised to remove throw rugs or fasten down. I have non-slip mats in the bathtub and shower:  Yes         All my home's stairs have railings or bannisters: No stairs in home         My home's floors, stairs and hallways are free from clutter, wires and cords:  Yes        Immunizations and Health Maintenance  There is no immunization history on file for this patient. Health Maintenance Due  Topic Date Due  . TETANUS/TDAP  04/09/1964  . ZOSTAVAX  04/09/2005  . DEXA SCAN  04/09/2010  Patient as general rule does not wish to receive vaccines. Discussed importance of TDaP, zostavax, pneumonia and influenza vaccines in detail. Patient given literature on TDaP and Zostavax and will consider receiving these vaccines at local pharmacy. Patient missed yesterday's appt for dexa scan. Contact info for Breast Center given to patient to reschedule appt.   Patient Care Team: Kinnie Feil, MD as PCP - General (Family Medicine) Patient sees Dr. Tamala Julian at eye center at [address] for eye care.  Indicate any recent Medical Services you may have received from other than [facility] providers in the past year (date may be approximate).     Assessment:   This is a routine wellness examination for Sherry Wilkinson.   Hearing/Vision screen  Hearing Screening   Method: Audiometry   125Hz  250Hz  500Hz  1000Hz  2000Hz  4000Hz  8000Hz   Right ear:   40 40 40    Left ear:   40 40 40    Comments: No sound detected at 4000 HZ bilaterally   Dietary issues and exercise activities discussed: Current Exercise Habits: Home exercise routine, Type of exercise: walking;yoga, Time (Minutes): 60, Frequency (Times/Week): 3, Weekly Exercise (Minutes/Week): 180, Intensity: Mild  Goals    . Blood Pressure < 140/90    . Exercise 150 minutes per week (moderate activity)    . Weight  < 143 lb (64.864 kg)     5% weight loss      Depression Screen PHQ 2/9 Scores 11/02/2015 10/17/2015 10/17/2015 12/16/2014 12/13/2014 05/17/2014 02/09/2013  PHQ - 2 Score 0 0 0 0 0 0 0    Fall Risk Fall Risk  11/02/2015 10/17/2015 10/17/2015 12/16/2014 12/13/2014  Falls in the past year? No No No No Yes  Number falls in past yr: - - - - 1  Injury with Fall? - - - - Yes    Cognitive Function: Mini-Cog passed with score 4/5  TUG Test:  Done in 12 seconds. Patient used both hands to push out of chair and to sit back down.  Screening Tests Health Maintenance  Topic Date Due  . TETANUS/TDAP  04/09/1964  . ZOSTAVAX  04/09/2005  . DEXA SCAN  04/09/2010  . PNA vac Low Risk Adult (1 of 2 - PCV13) 10/21/2016 (Originally 04/09/2010)  . INFLUENZA VACCINE  11/21/2015  . COLONOSCOPY  10/07/2016  . MAMMOGRAM  08/02/2017  . Hepatitis C Screening  Completed      Plan:     During the course of the visit, Sherry Wilkinson was educated and counseled about the following appropriate screening and preventive services:   Vaccines to include Pneumoccal, Influenza, Td, Zostavax  Cardiovascular disease screening  Colorectal cancer screening  Bone density screening  Diabetes screening  Mammography/PAP  Nutrition counseling  Patient Instructions (the written plan) were given to the patient.    Velora Heckler, RN   11/02/2015

## 2015-11-02 NOTE — Patient Instructions (Signed)
Bone Densitometry Bone densitometry is an imaging test that uses a special X-ray to measure the amount of calcium and other minerals in your bones (bone density). This test is also known as a bone mineral density test or dual-energy X-ray absorptiometry (DXA). The test can measure bone density at your hip and your spine. It is similar to having a regular X-ray. You may have this test to:  Diagnose a condition that causes weak or thin bones (osteoporosis).  Predict your risk of a broken bone (fracture).  Determine how well osteoporosis treatment is working. LET YOUR HEALTH CARE PROVIDER KNOW ABOUT:  Any allergies you have.  All medicines you are taking, including vitamins, herbs, eye drops, creams, and over-the-counter medicines.  Previous problems you or members of your family have had with the use of anesthetics.  Any blood disorders you have.  Previous surgeries you have had.  Medical conditions you have.  Possibility of pregnancy.  Any other medical test you had within the previous 14 days that used contrast material. RISKS AND COMPLICATIONS Generally, this is a safe procedure. However, problems can occur and may include the following:  This test exposes you to a very small amount of radiation.  The risks of radiation exposure may be greater to unborn children. BEFORE THE PROCEDURE  Do not take any calcium supplements for 24 hours before having the test. You can otherwise eat and drink what you usually do.  Take off all metal jewelry, eyeglasses, dental appliances, and any other metal objects. PROCEDURE  You may lie on an exam table. There will be an X-ray generator below you and an imaging device above you.  Other devices, such as boxes or braces, may be used to position your body properly for the scan.  You will need to lie still while the machine slowly scans your body.  The images will show up on a computer monitor. AFTER THE PROCEDURE You may need more testing  at a later time.   This information is not intended to replace advice given to you by your health care provider. Make sure you discuss any questions you have with your health care provider.   Document Released: 04/30/2004 Document Revised: 04/29/2014 Document Reviewed: 09/16/2013 Elsevier Interactive Patient Education 2016 Else[employer] Inc[employer]   Fall Prevention in the Home  Falls can cause injuries. They can happen to people of all ages. There are many things you can do to make your home safe and to help prevent falls.  WHAT CAN I DO ON THE OUTSIDE OF MY HOME?  Regularly fix the edges of walkways and driveways and fix any cracks.  Remove anything that might make you trip as you walk through a door, such as a raised step or threshold.  Trim any bushes or trees on the path to your home.  Use bright outdoor lighting.  Clear any walking paths of anything that might make someone trip, such as rocks or tools.  Regularly check to see if handrails are loose or broken. Make sure that both sides of any steps have handrails.  Any raised decks and porches should have guardrails on the edges.  Have any leaves, snow, or ice cleared regularly.  Use sand or salt on walking paths during winter.  Clean up any spills in your garage right away. This includes oil or grease spills. WHAT CAN I DO IN THE BATHROOM?   Use night lights.  Install grab bars by the toilet and in the tub and shower. Do not use   towel bars as grab bars.  Use non-skid mats or decals in the tub or shower.  If you need to sit down in the shower, use a plastic, non-slip stool.  Keep the floor dry. Clean up any water that spills on the floor as soon as it happens.  Remove soap buildup in the tub or shower regularly.  Attach bath mats securely with double-sided non-slip rug tape.  Do not have throw rugs and other things on the floor that can make you trip. WHAT CAN I DO IN THE BEDROOM?  Use night lights.  Make sure that you  have a light by your bed that is easy to reach.  Do not use any sheets or blankets that are too big for your bed. They should not hang down onto the floor.  Have a firm chair that has side arms. You can use this for support while you get dressed.  Do not have throw rugs and other things on the floor that can make you trip. WHAT CAN I DO IN THE KITCHEN?  Clean up any spills right away.  Avoid walking on wet floors.  Keep items that you use a lot in easy-to-reach places.  If you need to reach something above you, use a strong step stool that has a grab bar.  Keep electrical cords out of the way.  Do not use floor polish or wax that makes floors slippery. If you must use wax, use non-skid floor wax.  Do not have throw rugs and other things on the floor that can make you trip. WHAT CAN I DO WITH MY STAIRS?  Do not leave any items on the stairs.  Make sure that there are handrails on both sides of the stairs and use them. Fix handrails that are broken or loose. Make sure that handrails are as long as the stairways.  Check any carpeting to make sure that it is firmly attached to the stairs. Fix any carpet that is loose or worn.  Avoid having throw rugs at the top or bottom of the stairs. If you do have throw rugs, attach them to the floor with carpet tape.  Make sure that you have a light switch at the top of the stairs and the bottom of the stairs. If you do not have them, ask someone to add them for you. WHAT ELSE CAN I DO TO HELP PREVENT FALLS?  Wear shoes that:  Do not have high heels.  Have rubber bottoms.  Are comfortable and fit you well.  Are closed at the toe. Do not wear sandals.  If you use a stepladder:  Make sure that it is fully opened. Do not climb a closed stepladder.  Make sure that both sides of the stepladder are locked into place.  Ask someone to hold it for you, if possible.  Clearly mark and make sure that you can see:  Any grab bars or  handrails.  First and last steps.  Where the edge of each step is.  Use tools that help you move around (mobility aids) if they are needed. These include:  Canes.  Walkers.  Scooters.  Crutches.  Turn on the lights when you go into a dark area. Replace any light bulbs as soon as they burn out.  Set up your furniture so you have a clear path. Avoid moving your furniture around.  If any of your floors are uneven, fix them.  If there are any pets around you, be aware of where   they are.  Review your medicines with your doctor. Some medicines can make you feel dizzy. This can increase your chance of falling. Ask your doctor what other things that you can do to help prevent falls.   This information is not intended to replace advice given to you by your health care provider. Make sure you discuss any questions you have with your health care provider.   Document Released: 02/02/2009 Document Revised: 08/23/2014 Document Reviewed: 05/13/2014 Elsevier Interactive Patient Education 2016 [employer] Inc[employer]  Health Maintenance, Female Adopting a healthy lifestyle and getting preventive care can go a long way to promote health and wellness. Talk with your health care provider about what schedule of regular examinations is right for you. This is a good chance for you to check in with your provider about disease prevention and staying healthy. In between checkups, there are plenty of things you can do on your own. Experts have done a lot of research about which lifestyle changes and preventive measures are most likely to keep you healthy. Ask your health care provider for more information. WEIGHT AND DIET  Eat a healthy diet  Be sure to include plenty of vegetables, fruits, low-fat dairy products, and lean protein.  Do not eat a lot of foods high in solid fats, added sugars, or salt.  Get regular exercise. This is one of the most important things you can do for your health.  Most adults  should exercise for at least 150 minutes each week. The exercise should increase your heart rate and make you sweat (moderate-intensity exercise).  Most adults should also do strengthening exercises at least twice a week. This is in addition to the moderate-intensity exercise.  Maintain a healthy weight  Body mass index (BMI) is a measurement that can be used to identify possible weight problems. It estimates body fat based on height and weight. Your health care provider can help determine your BMI and help you achieve or maintain a healthy weight.  For females 20 years of age and older:   A BMI below 18.5 is considered underweight.  A BMI of 18.5 to 24.9 is normal.  A BMI of 25 to 29.9 is considered overweight.  A BMI of 30 and above is considered obese.  Watch levels of cholesterol and blood lipids  You should start having your blood tested for lipids and cholesterol at 71 years of age, then have this test every 5 years.  You may need to have your cholesterol levels checked more often if:  Your lipid or cholesterol levels are high.  You are older than 71 years of age.  You are at high risk for heart disease.  CANCER SCREENING   Lung Cancer  Lung cancer screening is recommended for adults 55-80 years old who are at high risk for lung cancer because of a history of smoking.  A yearly low-dose CT scan of the lungs is recommended for people who:  Currently smoke.  Have quit within the past 15 years.  Have at least a 30-pack-year history of smoking. A pack year is smoking an average of one pack of cigarettes a day for 1 year.  Yearly screening should continue until it has been 15 years since you quit.  Yearly screening should stop if you develop a health problem that would prevent you from having lung cancer treatment.  Breast Cancer  Practice breast self-awareness. This means understanding how your breasts normally appear and feel.  It also means doing regular  breast self-exams.   Let your health care provider know about any changes, no matter how small.  If you are in your 20s or 30s, you should have a clinical breast exam (CBE) by a health care provider every 1-3 years as part of a regular health exam.  If you are 40 or older, have a CBE every year. Also consider having a breast X-ray (mammogram) every year.  If you have a family history of breast cancer, talk to your health care provider about genetic screening.  If you are at high risk for breast cancer, talk to your health care provider about having an MRI and a mammogram every year.  Breast cancer gene (BRCA) assessment is recommended for women who have family members with BRCA-related cancers. BRCA-related cancers include:  Breast.  Ovarian.  Tubal.  Peritoneal cancers.  Results of the assessment will determine the need for genetic counseling and BRCA1 and BRCA2 testing. Cervical Cancer Your health care provider may recommend that you be screened regularly for cancer of the pelvic organs (ovaries, uterus, and vagina). This screening involves a pelvic examination, including checking for microscopic changes to the surface of your cervix (Pap test). You may be encouraged to have this screening done every 3 years, beginning at age 21.  For women ages 30-65, health care providers may recommend pelvic exams and Pap testing every 3 years, or they may recommend the Pap and pelvic exam, combined with testing for human papilloma virus (HPV), every 5 years. Some types of HPV increase your risk of cervical cancer. Testing for HPV may also be done on women of any age with unclear Pap test results.  Other health care providers may not recommend any screening for nonpregnant women who are considered low risk for pelvic cancer and who do not have symptoms. Ask your health care provider if a screening pelvic exam is right for you.  If you have had past treatment for cervical cancer or a condition that  could lead to cancer, you need Pap tests and screening for cancer for at least 20 years after your treatment. If Pap tests have been discontinued, your risk factors (such as having a new sexual partner) need to be reassessed to determine if screening should resume. Some women have medical problems that increase the chance of getting cervical cancer. In these cases, your health care provider may recommend more frequent screening and Pap tests. Colorectal Cancer  This type of cancer can be detected and often prevented.  Routine colorectal cancer screening usually begins at 71 years of age and continues through 71 years of age.  Your health care provider may recommend screening at an earlier age if you have risk factors for colon cancer.  Your health care provider may also recommend using home test kits to check for hidden blood in the stool.  A small camera at the end of a tube can be used to examine your colon directly (sigmoidoscopy or colonoscopy). This is done to check for the earliest forms of colorectal cancer.  Routine screening usually begins at age 50.  Direct examination of the colon should be repeated every 5-10 years through 71 years of age. However, you may need to be screened more often if early forms of precancerous polyps or small growths are found. Skin Cancer  Check your skin from head to toe regularly.  Tell your health care provider about any new moles or changes in moles, especially if there is a change in a mole's shape or color.  Also tell your   health care provider if you have a mole that is larger than the size of a pencil eraser.  Always use sunscreen. Apply sunscreen liberally and repeatedly throughout the day.  Protect yourself by wearing long sleeves, pants, a wide-brimmed hat, and sunglasses whenever you are outside. HEART DISEASE, DIABETES, AND HIGH BLOOD PRESSURE   High blood pressure causes heart disease and increases the risk of stroke. High blood pressure  is more likely to develop in:  People who have blood pressure in the high end of the normal range (130-139/85-89 mm Hg).  People who are overweight or obese.  People who are African American.  If you are 18-39 years of age, have your blood pressure checked every 3-5 years. If you are 40 years of age or older, have your blood pressure checked every year. You should have your blood pressure measured twice--once when you are at a hospital or clinic, and once when you are not at a hospital or clinic. Record the average of the two measurements. To check your blood pressure when you are not at a hospital or clinic, you can use:  An automated blood pressure machine at a pharmacy.  A home blood pressure monitor.  If you are between 55 years and 79 years old, ask your health care provider if you should take aspirin to prevent strokes.  Have regular diabetes screenings. This involves taking a blood sample to check your fasting blood sugar level.  If you are at a normal weight and have a low risk for diabetes, have this test once every three years after 71 years of age.  If you are overweight and have a high risk for diabetes, consider being tested at a younger age or more often. PREVENTING INFECTION  Hepatitis B  If you have a higher risk for hepatitis B, you should be screened for this virus. You are considered at high risk for hepatitis B if:  You were born in a country where hepatitis B is common. Ask your health care provider which countries are considered high risk.  Your parents were born in a high-risk country, and you have not been immunized against hepatitis B (hepatitis B vaccine).  You have HIV or AIDS.  You use needles to inject street drugs.  You live with someone who has hepatitis B.  You have had sex with someone who has hepatitis B.  You get hemodialysis treatment.  You take certain medicines for conditions, including cancer, organ transplantation, and autoimmune  conditions. Hepatitis C  Blood testing is recommended for:  Everyone born from 1945 through 1965.  Anyone with known risk factors for hepatitis C. Sexually transmitted infections (STIs)  You should be screened for sexually transmitted infections (STIs) including gonorrhea and chlamydia if:  You are sexually active and are younger than 71 years of age.  You are older than 71 years of age and your health care provider tells you that you are at risk for this type of infection.  Your sexual activity has changed since you were last screened and you are at an increased risk for chlamydia or gonorrhea. Ask your health care provider if you are at risk.  If you do not have HIV, but are at risk, it may be recommended that you take a prescription medicine daily to prevent HIV infection. This is called pre-exposure prophylaxis (PrEP). You are considered at risk if:  You are sexually active and do not regularly use condoms or know the HIV status of your partner(s).  You   take drugs by injection.  You are sexually active with a partner who has HIV. Talk with your health care provider about whether you are at high risk of being infected with HIV. If you choose to begin PrEP, you should first be tested for HIV. You should then be tested every 3 months for as long as you are taking PrEP.  PREGNANCY   If you are premenopausal and you may become pregnant, ask your health care provider about preconception counseling.  If you may become pregnant, take 400 to 800 micrograms (mcg) of folic acid every day.  If you want to prevent pregnancy, talk to your health care provider about birth control (contraception). OSTEOPOROSIS AND MENOPAUSE   Osteoporosis is a disease in which the bones lose minerals and strength with aging. This can result in serious bone fractures. Your risk for osteoporosis can be identified using a bone density scan.  If you are 65 years of age or older, or if you are at risk for  osteoporosis and fractures, ask your health care provider if you should be screened.  Ask your health care provider whether you should take a calcium or vitamin D supplement to lower your risk for osteoporosis.  Menopause may have certain physical symptoms and risks.  Hormone replacement therapy may reduce some of these symptoms and risks. Talk to your health care provider about whether hormone replacement therapy is right for you.  HOME CARE INSTRUCTIONS   Schedule regular health, dental, and eye exams.  Stay current with your immunizations.   Do not use any tobacco products including cigarettes, chewing tobacco, or electronic cigarettes.  If you are pregnant, do not drink alcohol.  If you are breastfeeding, limit how much and how often you drink alcohol.  Limit alcohol intake to no more than 1 drink per day for nonpregnant women. One drink equals 12 ounces of beer, 5 ounces of wine, or 1 ounces of hard liquor.  Do not use street drugs.  Do not share needles.  Ask your health care provider for help if you need support or information about quitting drugs.  Tell your health care provider if you often feel depressed.  Tell your health care provider if you have ever been abused or do not feel safe at home.   This information is not intended to replace advice given to you by your health care provider. Make sure you discuss any questions you have with your health care provider.   Document Released: 10/22/2010 Document Revised: 04/29/2014 Document Reviewed: 03/10/2013 Elsevier Interactive Patient Education 2016 Elsevier Inc.   Hearing Loss Hearing loss is a partial or total loss of the ability to hear. This can be temporary or permanent, and it can happen in one or both ears. Hearing loss may be referred to as deafness. Medical care is necessary to treat hearing loss properly and to prevent the condition from getting worse. Your hearing may partially or completely come back,  depending on what caused your hearing loss and how severe it is. In some cases, hearing loss is permanent. CAUSES Common causes of hearing loss include:   Too much wax in the ear canal.   Infection of the ear canal or middle ear.   Fluid in the middle ear.   Injury to the ear or surrounding area.   An object stuck in the ear.   Prolonged exposure to loud sounds, such as music.  Less common causes of hearing loss include:   Tumors in the ear.     Viral or bacterial infections, such as meningitis.   A hole in the eardrum (perforated eardrum).  Problems with the hearing nerve that sends signals between the brain and the ear.  Certain medicines.  SYMPTOMS  Symptoms of this condition may include:  Difficulty telling the difference between sounds.  Difficulty following a conversation when there is background noise.  Lack of response to sounds in your environment. This may be most noticeable when you do not respond to startling sounds.  Needing to turn up the volume on the television, radio, etc.  Ringing in the ears.  Dizziness.  Pain in the ears. DIAGNOSIS This condition is diagnosed based on a physical exam and a hearing test (audiometry). The audiometry test will be performed by a hearing specialist (audiologist). You may also be referred to an ear, nose, and throat (ENT) specialist (otolaryngologist).  TREATMENT Treatment for recent onset of hearing loss may include:   Ear wax removal.   Being prescribed medicines to prevent infection (antibiotics).   Being prescribed medicines to reduce inflammation (corticosteroids).  HOME CARE INSTRUCTIONS  If you were prescribed an antibiotic medicine, take it as told by your health care provider. Do not stop taking the antibiotic even if you start to feel better.  Take over-the-counter and prescription medicines only as told by your health care provider.  Avoid loud noises.   Return to your normal activities  as told by your health care provider. Ask your health care provider what activities are safe for you.  Keep all follow-up visits as told by your health care provider. This is important. SEEK MEDICAL CARE IF:   You feel dizzy.   You develop new symptoms.   You vomit or feel nauseous.   You have a fever.  SEEK IMMEDIATE MEDICAL CARE IF:  You develop sudden changes in your vision.   You have severe ear pain.   You have new or increased weakness.  You have a severe headache.   This information is not intended to replace advice given to you by your health care provider. Make sure you discuss any questions you have with your health care provider.   Document Released: 04/08/2005 Document Revised: 12/28/2014 Document Reviewed: 08/24/2014 Elsevier Interactive Patient Education 2016 Elsevier Inc.  

## 2015-11-02 NOTE — Progress Notes (Signed)
Patient ID: Sherry Wilkinson, female   DOB: 06-25-1945, 70 y.o.   MRN: [id number] I have reviewed this visit and discussed with Howell Rucks, RN, BSN, and agree with her documentation

## 2015-11-07 ENCOUNTER — Encounter: Payer: Self-pay | Admitting: Family Medicine

## 2015-11-07 NOTE — Progress Notes (Signed)
Left HIPAA compliant message on patient's VM to return call  Raydel Hosick, Orvis Brill, RN

## 2015-11-07 NOTE — Progress Notes (Signed)
Patient ID: Sherry Wilkinson, female   DOB: 05-07-1945, 70 y.o.   MRN: [id number] Patient brought in her living will to be scanned to epic. This was handed over to [employer]. She will contact patient to see if she wants this copy back or not after scanning the document.

## 2015-11-08 ENCOUNTER — Telehealth: Payer: Self-pay | Admitting: Family Medicine

## 2015-11-08 NOTE — Telephone Encounter (Signed)
Pt was returning your call, would like for you to call her back. ep

## 2015-11-09 NOTE — Telephone Encounter (Signed)
Patient aware that Living Will has been scanned into EPIC and copies placed in confidential shredder. Patient states she will not be getting zostavax or TDaP vaccinations but will [facility] to reschedule Dex[facility]. Velora Heckler, RN

## 2015-11-29 ENCOUNTER — Ambulatory Visit
Admission: RE | Admit: 2015-11-29 | Discharge: 2015-11-29 | Disposition: A | Discharge status: Home / Self Care | Payer: Medicare Other | Source: Ambulatory Visit | Attending: Family Medicine | Admitting: Family Medicine

## 2015-11-29 ENCOUNTER — Telehealth: Payer: Self-pay | Admitting: Family Medicine

## 2015-11-29 DIAGNOSIS — Z780 Asymptomatic menopausal state: Secondary | ICD-10-CM | POA: Diagnosis not present

## 2015-11-29 DIAGNOSIS — M810 Age-related osteoporosis without current pathological fracture: Secondary | ICD-10-CM | POA: Diagnosis not present

## 2015-11-29 DIAGNOSIS — E2839 Other primary ovarian failure: Secondary | ICD-10-CM

## 2015-11-29 NOTE — Telephone Encounter (Signed)
Spoke with patient and gave her instructions from MD to start the 2 medications.  She would like to speak with MD to ask a few more details as to why she needs them. Jazmin Hartsell,CMA

## 2015-11-29 NOTE — Telephone Encounter (Signed)
I contacted her to discuss Dexa scan report. She did not pick up. Message left to call me back at her earliest convenience. She will need to be started on OscalD and Bi-phosphonate. Will discuss with her when she calls back.

## 2015-11-30 ENCOUNTER — Telehealth: Payer: Self-pay | Admitting: Family Medicine

## 2015-11-30 MED ORDER — ALENDRONATE SODIUM 70 MG PO TABS: 70.0000 mg | ORAL_TABLET | ORAL | 2 refills | Status: DC

## 2015-11-30 MED ORDER — CALCIUM CARBONATE-VITAMIN D 500-200 MG-UNIT PO TABS: 1.0000 | ORAL_TABLET | Freq: Two times a day (BID) | ORAL | 4 refills | Status: DC

## 2015-11-30 NOTE — Telephone Encounter (Signed)
Pt was returning Dr. Macario Golds phone call. Please give pt a call when you get the chance. Thank you! ep

## 2015-11-30 NOTE — Telephone Encounter (Signed)
I contacted her again about result. No response. Message left to call me back.  Will send prescription to the pharmacy.

## 2015-11-30 NOTE — Telephone Encounter (Signed)
Will forward to MD. Jazmin Hartsell,CMA  

## 2015-12-01 NOTE — Telephone Encounter (Signed)
Result discussed with patient. Treatment instruction given. She is aware to pick up meds at the pharmacy.  12/01/15 at 2:28pm

## 2015-12-08 ENCOUNTER — Encounter: Payer: Self-pay | Admitting: Family Medicine

## 2015-12-08 NOTE — Progress Notes (Signed)
I received a letter request for dexa scan result to be mailed to her home address. This was mailed to the home address listed on file.

## 2016-04-22 HISTORY — PX: COLONOSCOPY: SHX174

## 2016-06-10 DIAGNOSIS — H2513 Age-related nuclear cataract, bilateral: Secondary | ICD-10-CM | POA: Diagnosis not present

## 2016-06-21 ENCOUNTER — Other Ambulatory Visit: Payer: Self-pay | Admitting: Family Medicine

## 2016-06-21 DIAGNOSIS — Z1231 Encounter for screening mammogram for malignant neoplasm of breast: Secondary | ICD-10-CM

## 2016-08-05 ENCOUNTER — Ambulatory Visit
Admission: RE | Admit: 2016-08-05 | Discharge: 2016-08-05 | Disposition: A | Discharge status: Home / Self Care | Payer: Medicare Other | Source: Ambulatory Visit | Attending: Family Medicine | Admitting: Family Medicine

## 2016-08-05 DIAGNOSIS — Z1231 Encounter for screening mammogram for malignant neoplasm of breast: Secondary | ICD-10-CM

## 2016-08-13 ENCOUNTER — Ambulatory Visit (HOSPITAL_COMMUNITY)
Admission: RE | Admit: 2016-08-13 | Discharge: 2016-08-13 | Disposition: A | Discharge status: Home / Self Care | Payer: Medicare Other | Source: Ambulatory Visit | Attending: Family Medicine | Admitting: Family Medicine

## 2016-08-13 ENCOUNTER — Ambulatory Visit (INDEPENDENT_AMBULATORY_CARE_PROVIDER_SITE_OTHER): Payer: Medicare Other | Admitting: Family Medicine

## 2016-08-13 ENCOUNTER — Encounter: Payer: Self-pay | Admitting: Family Medicine

## 2016-08-13 VITALS — BP 116/71 | HR 80 | Temp 98.2°F | Wt 164.0 lb

## 2016-08-13 DIAGNOSIS — M810 Age-related osteoporosis without current pathological fracture: Secondary | ICD-10-CM | POA: Insufficient documentation

## 2016-08-13 DIAGNOSIS — R0602 Shortness of breath: Secondary | ICD-10-CM | POA: Insufficient documentation

## 2016-08-13 DIAGNOSIS — R0902 Hypoxemia: Secondary | ICD-10-CM | POA: Diagnosis not present

## 2016-08-13 MED ORDER — ALBUTEROL SULFATE HFA 108 (90 BASE) MCG/ACT IN AERS: 2.0000 | INHALATION_SPRAY | Freq: Four times a day (QID) | RESPIRATORY_TRACT | 2 refills | Status: DC | PRN

## 2016-08-13 NOTE — Patient Instructions (Signed)
It was nice seeing you today. I have escribed albuterol to your pharmacy. Please use it as needed. Also schedule PFT with Dr.Koval on your way out. We will schedule your ECHO today and please go to the hospital for your xray anytime from today.

## 2016-08-13 NOTE — Progress Notes (Addendum)
Subjective:     Patient ID: Sherry Wilkinson, female   DOB: 23-Dec-1945, 71 y.o.   MRN: [id number]  Shortness of Breath  This is a new problem. The current episode started more than 1 month ago (Started about 4 months ago. She was lying down in bed and she heard herself forcefully breathing). The problem occurs intermittently. The problem has been unchanged. The average episode lasts 3 months. Associated symptoms include wheezing. Pertinent negatives include no chest pain, fever, leg swelling, orthopnea, PND, syncope or vomiting. The symptoms are aggravated by exercise, lying flat, weather changes and smoke (Climbing up the stairs and walking few distance can aggravate her symptoms). Risk factors: Former smoker, she smoked lightly (1 Pack last 1-2 weeks) for 15-20 yrs on and off. Treatments tried: herbal medication. The treatment provided mild relief. There is no history of aspirin allergies, chronic lung disease, COPD, a heart failure or PE.  Osteoporosis:She stated she uses only calcium and Vit D supplement with herbs, she stopped her Fosamax due to cost. She endorsed occasional joint pain but bearable.  Current Outpatient Prescriptions on File Prior to Visit  Medication Sig Dispense Refill  . calcium-vitamin D (OSCAL WITH D) 500-200 MG-UNIT tablet Take 1 tablet by mouth 2 (two) times daily. 60 tablet 4  . alendronate (FOSAMAX) 70 MG tablet Take 1 tablet (70 mg total) by mouth once a week. Take with a full glass of water on an empty stomach while sitting up. (Patient not taking: Reported on 08/13/2016) 12 tablet 2   No current facility-administered medications on file prior to visit.    Past Medical History:  Diagnosis Date  . Appendicitis   . Liver cyst      Review of Systems  Constitutional: Negative for fever.  Respiratory: Positive for shortness of breath and wheezing.   Cardiovascular: Negative.  Negative for chest pain, orthopnea, leg swelling, syncope and PND.  Gastrointestinal:  Negative.  Negative for vomiting.  Musculoskeletal: Negative for joint swelling.  Neurological: Negative.   All other systems reviewed and are negative.      Objective:   Physical Exam  Constitutional: She is oriented to person, place, and time. She appears well-developed. No distress.  Cardiovascular: Normal rate, regular rhythm and normal heart sounds.   No murmur heard. Pulmonary/Chest: Effort normal and breath sounds normal. No respiratory distress. She has no wheezes. She exhibits no tenderness.  Abdominal: Soft. Bowel sounds are normal. She exhibits no distension and no mass. There is no tenderness.  Musculoskeletal: Normal range of motion. She exhibits no edema.  Neurological: She is alert and oriented to person, place, and time.  Nursing note and vitals reviewed.     Dg Chest 2 View  Result Date: 08/13/2016 CLINICAL DATA:  Shortness of breath. EXAM: CHEST  2 VIEW COMPARISON:  Chest x-ray 11/02/2009. FINDINGS: Mediastinum and hilar structures are normal. Lungs are clear. No pleural effusion or pneumothorax. Heart size normal. No acute bony abnormality. IMPRESSION: No acute cardiopulmonary disease. Electronically Signed   By: Marcello Moores  Register   On: 08/13/2016 09:57   Assessment:     SOB and wheezing Osteoporosis    Plan:     Check problem list.  Patient called me back. Xray report discussed with her.

## 2016-08-13 NOTE — Assessment & Plan Note (Signed)
Chronic, currently asymptomatic but flares up intermittently. Differentials include: COPD ( hx of smoking), CHF ( SOB worsen on exertion). Chest xray done today was normal. I called to discuss with her but she did not pick up. Message left for her to call me back. Schedule PFT appointment with Dr. Valentina Lucks. ECHO ordered to assess LVEF. Albuterol prescribed prn SOB and wheezing. Return precaution discussed.

## 2016-08-13 NOTE — Assessment & Plan Note (Signed)
She had positive Dexa scan Aug 2017. She was started on Fosamax then but she self d/ced it due to cost. Continue Oscal D for now. Add calcium rich food to diet. F/U as needed.

## 2016-08-15 ENCOUNTER — Ambulatory Visit (INDEPENDENT_AMBULATORY_CARE_PROVIDER_SITE_OTHER): Payer: Medicare Other | Admitting: Pharmacist

## 2016-08-15 ENCOUNTER — Encounter: Payer: Self-pay | Admitting: Pharmacist

## 2016-08-15 DIAGNOSIS — R0602 Shortness of breath: Secondary | ICD-10-CM

## 2016-08-15 MED ORDER — FLUTICASONE PROPIONATE 50 MCG/ACT NA SUSP: 1.0000 | Freq: Every day | NASAL | 0 refills | Status: DC

## 2016-08-15 MED ORDER — LORATADINE 10 MG PO TABS: 10.0000 mg | ORAL_TABLET | Freq: Every day | ORAL | 0 refills | Status: DC

## 2016-08-15 NOTE — Progress Notes (Signed)
   S:    Patient arrives in good spirits ambulating without assisance.    Presents for lung function evaluation.   Patient was referred on 08/13/16.  Patient was last seen by Primary Care Provider on 08/13/16. Patient reports breathing has been worse since February or March.  When walking up a flight of stairs she has "chest pressure" and wheezing. She reports congestion almost every morning.  She states her breathing is not a problem unless going up an incline. She does have nasal congestion when getting up in the morning which resolves after she showers and gets ready.    Smoked for ~20 years (quit 40 years ago) with <5 cigarettes per day (1 pack of cigarettes would last ~ 1 week).   She states she has trouble with fungus in her bedroom and it is always damp in her room.  She has complained to her apartment complex. She does have hard floors in the apartment.   Patient reports last dose of COPD medications was: N/A.  Has not picked up albuterol.    O: mMRC score= 1 See Documentation Flowsheet - CAT/COPD for complete symptom scoring.  See "scanned report" or Documentation Flowsheet (discrete results - PFTs) for  Spirometry results. Patient provided good effort while attempting spirometry.   A/P: Spirometry evaluation with Pre-Bronchodilator reveals normal lung function. Wheezing/SOB upon exertion likely exacerbated by mold/fungus from her damp apartment or other environmental irritant.    -Start Claritin (loratadine) 10 mg daily for allergy symptoms -Start Flonase (fluticasone) nasal spray 1-2 puffs in your nose daily.   -Discussed using Damp Rid to remove some of the moisture and an "allergy proof" pillow case for your pillow.  Also discussed talking with her landlord about the mold/fungus.    Reviewed results of pulmonary function tests.  Pt verbalized understanding of results and education.  Written pt instructions provided.   Total time in face to face counseling 45 minutes.  Patient seen  with Ida Rogue, PharmD Candidate, and Bennye Alm, PharmD, BCPS, PGY2 Resident.

## 2016-08-15 NOTE — Patient Instructions (Addendum)
Thank you for coming in today   Start Claritin (loratadine) 10 mg daily for allergy symptoms Start Flonase (fluticasone) nasal spray 1-2 puffs in your nose daily.    Try using Damp Rid to remove some of the moisture from your apartment  Try an "allergy proof" pillow case for your pillow.

## 2016-08-15 NOTE — Assessment & Plan Note (Signed)
Spirometry evaluation with Pre-Bronchodilator reveals normal lung function. Wheezing/SOB upon exertion likely exacerbated by mold/fungus from her damp apartment or other environmental irritant.    -Start Claritin (loratadine) 10 mg daily for allergy symptoms -Start Flonase (fluticasone) nasal spray 1-2 puffs in your nose daily.   -Discussed using Damp Rid to remove some of the moisture and an "allergy proof" pillow case for your pillow.  Also discussed talking with her landlord about the mold/fungus.

## 2016-08-19 NOTE — Progress Notes (Signed)
Patient ID: Sherry Wilkinson, female   DOB: [birth year], 71 y.o.   MRN: [id number] Reviewed: Agree with Dr. Graylin Shiver documentation and management.

## 2016-08-22 ENCOUNTER — Encounter: Payer: Self-pay | Admitting: Family Medicine

## 2016-08-26 ENCOUNTER — Ambulatory Visit (HOSPITAL_COMMUNITY): Payer: Medicare Other | Attending: Cardiology

## 2016-08-26 ENCOUNTER — Other Ambulatory Visit: Payer: Self-pay

## 2016-08-26 DIAGNOSIS — I361 Nonrheumatic tricuspid (valve) insufficiency: Secondary | ICD-10-CM | POA: Diagnosis not present

## 2016-08-26 DIAGNOSIS — I5030 Unspecified diastolic (congestive) heart failure: Secondary | ICD-10-CM | POA: Diagnosis not present

## 2016-08-26 DIAGNOSIS — R0602 Shortness of breath: Secondary | ICD-10-CM | POA: Diagnosis not present

## 2016-08-28 ENCOUNTER — Encounter: Payer: Self-pay | Admitting: Family Medicine

## 2016-09-13 ENCOUNTER — Encounter: Payer: Self-pay | Admitting: Internal Medicine

## 2017-01-16 ENCOUNTER — Encounter: Payer: Self-pay | Admitting: Internal Medicine

## 2017-01-24 ENCOUNTER — Ambulatory Visit (INDEPENDENT_AMBULATORY_CARE_PROVIDER_SITE_OTHER): Payer: Medicare Other | Admitting: Family Medicine

## 2017-01-24 ENCOUNTER — Other Ambulatory Visit (HOSPITAL_COMMUNITY)
Admission: RE | Admit: 2017-01-24 | Discharge: 2017-01-24 | Disposition: A | Discharge status: Home / Self Care | Payer: Medicare Other | Source: Ambulatory Visit | Attending: Family Medicine | Admitting: Family Medicine

## 2017-01-24 ENCOUNTER — Encounter: Payer: Self-pay | Admitting: Family Medicine

## 2017-01-24 VITALS — BP 118/68 | HR 70 | Ht 61.5 in | Wt 153.0 lb

## 2017-01-24 DIAGNOSIS — Z124 Encounter for screening for malignant neoplasm of cervix: Secondary | ICD-10-CM | POA: Diagnosis not present

## 2017-01-24 DIAGNOSIS — K5900 Constipation, unspecified: Secondary | ICD-10-CM | POA: Diagnosis present

## 2017-01-24 DIAGNOSIS — K649 Unspecified hemorrhoids: Secondary | ICD-10-CM

## 2017-01-24 DIAGNOSIS — Z01419 Encounter for gynecological examination (general) (routine) without abnormal findings: Secondary | ICD-10-CM | POA: Diagnosis not present

## 2017-01-24 NOTE — Assessment & Plan Note (Signed)
Ext hemorrhoid present. ?? Internal hemorrhoid. Mild tenderness with rectal exam. She has upcoming appointment with GI for colonoscopy. High fiber diet recommended. F/U as needed.

## 2017-01-24 NOTE — Assessment & Plan Note (Signed)
PAP completed today. I will contact her with result.

## 2017-01-24 NOTE — Patient Instructions (Signed)
  Please use colace or Miralax OTC for constipation. See your GI as planned for colonoscopy.  Hemorrhoids Hemorrhoids are swollen veins in and around the rectum or anus. Hemorrhoids can cause pain, itching, or bleeding. Most of the time, they do not cause serious problems. They usually get better with diet changes, lifestyle changes, and other home treatments. Follow these instructions at home: Eating and drinking  Eat foods that have fiber, such as whole grains, beans, nuts, fruits, and vegetables. Ask your doctor about taking products that have added fiber (fibersupplements).  Drink enough fluid to keep your pee (urine) clear or pale yellow. For Pain and Swelling  Take a warm-water bath (sitz bath) for 20 minutes to ease pain. Do this 3-4 times a day.  If directed, put ice on the painful area. It may be helpful to use ice between your warm baths. ? Put ice in a plastic bag. ? Place a towel between your skin and the bag. ? Leave the ice on for 20 minutes, 2-3 times a day. General instructions  Take over-the-counter and prescription medicines only as told by your doctor. ? Medicated creams and medicines that are inserted into the anus (suppositories) may be used or applied as told.  Exercise often.  Go to the bathroom when you have the urge to poop (to have a bowel movement). Do not wait.  Avoid pushing too hard (straining) when you poop.  Keep the butt area dry and clean. Use wet toilet paper or moist paper towels.  Do not sit on the toilet for a long time. Contact a doctor if:  You have any of these: ? Pain and swelling that do not get better with treatment or medicine. ? Bleeding that will not stop. ? Trouble pooping or you cannot poop. ? Pain or swelling outside the area of the hemorrhoids. This information is not intended to replace advice given to you by your health care provider. Make sure you discuss any questions you have with your health care provider. Document  Released: 01/16/2008 Document Revised: 09/14/2015 Document Reviewed: 12/21/2014 Elsevier Interactive Patient Education  [employer].

## 2017-01-24 NOTE — Assessment & Plan Note (Signed)
Diet induced. Avoid triggers. Miralax qd recommended or Colace. She prefers Colace and she will get this OTC. High fiber diet recommended. F/U as needed.

## 2017-01-24 NOTE — Progress Notes (Signed)
Subjective:     Patient ID: Sherry Wilkinson, female   DOB: 07-12-1945, 71 y.o.   MRN: [id number]  HPI  Hemorrhoid/Constipation:She ate watermelon and cantaloupe few days ago which usually gets her constipated and worsened her hemorrhoid. She took enema and it improved some. Her last BM was this morning, it was a bit hard. No blood in her stool. She is scheduled to see GI in 6 weeks for colonoscopy. She has had hemorrhoid since the 70s.Apart from enema, she also used Prune juice and senna leave which helped some. She endorsed occasional suprapubic pain. Gyn exam:Here for follow-up. Denies vaginal discharge. KW:IOXB vaccination update.   Current Outpatient Prescriptions on File Prior to Visit  Medication Sig Dispense Refill  . albuterol (PROVENTIL HFA;VENTOLIN HFA) 108 (90 Base) MCG/ACT inhaler Inhale 2 puffs into the lungs every 6 (six) hours as needed for wheezing or shortness of breath. (Patient not taking: Reported on 08/15/2016) 1 Inhaler 2  . calcium-vitamin D (OSCAL WITH D) 500-200 MG-UNIT tablet Take 1 tablet by mouth 2 (two) times daily. 60 tablet 4  . fluticasone (FLONASE) 50 MCG/ACT nasal spray Place 1-2 sprays into both nostrils daily. 16 g 0  . loratadine (CLARITIN) 10 MG tablet Take 1 tablet (10 mg total) by mouth daily. 30 tablet 0   No current facility-administered medications on file prior to visit.    Past Medical History:  Diagnosis Date  . Appendicitis   . Liver cyst       Review of Systems  Respiratory: Negative.   Cardiovascular: Negative.   Gastrointestinal: Positive for abdominal pain and constipation. Negative for anal bleeding, blood in stool, nausea and vomiting.  Genitourinary: Negative.   All other systems reviewed and are negative.      Objective:   Physical Exam  Constitutional: She is oriented to person, place, and time. She appears well-developed. No distress.  Cardiovascular: Normal rate, regular rhythm, normal heart sounds and intact distal  pulses.   No murmur heard. Pulmonary/Chest: Effort normal and breath sounds normal. No respiratory distress. She has no wheezes.  Abdominal: Soft. Bowel sounds are normal. She exhibits no distension and no mass. There is no tenderness.  Genitourinary: Uterus normal. Rectal exam shows external hemorrhoid and tenderness. Rectal exam shows no fissure. Pelvic exam was performed with patient supine. No labial fusion. There is no rash, tenderness or lesion on the right labia. There is no rash, tenderness or lesion on the left labia. Cervix exhibits no motion tenderness and no friability. Right adnexum displays no mass and no tenderness. Left adnexum displays no mass and no tenderness.  Musculoskeletal: Normal range of motion. She exhibits no edema.  Neurological: She is alert and oriented to person, place, and time.  Nursing note and vitals reviewed.      Assessment:     Constipation Hemorrhoid Gyn exam    Plan:     Check problem list. She declined flu shot, Tdap and Pneumovax today.

## 2017-01-28 ENCOUNTER — Encounter: Payer: Self-pay | Admitting: Family Medicine

## 2017-01-28 ENCOUNTER — Telehealth: Payer: Self-pay | Admitting: Family Medicine

## 2017-01-28 LAB — CYTOLOGY - PAP
ADEQUACY: ABSENT
Diagnosis: NEGATIVE
HPV: NOT DETECTED

## 2017-01-28 NOTE — Telephone Encounter (Signed)
Patient returned my call. I discussed neg PAP with her. Ideally given that she is 21 and she has a normal test; we need not repeat PAP for her. 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 If she was younger, I will recommend repeat PAP in a year. However for her we can safely stop screening now. She stated she will prefer to be screened one more time. Repeat PAP in 1 yr at patient's request.

## 2017-01-28 NOTE — Telephone Encounter (Signed)
HIPPA compliant message left to call back.

## 2017-02-25 ENCOUNTER — Ambulatory Visit (AMBULATORY_SURGERY_CENTER): Payer: Self-pay | Admitting: *Deleted

## 2017-02-25 VITALS — Ht 61.5 in | Wt 155.0 lb

## 2017-02-25 DIAGNOSIS — Z86010 Personal history of colonic polyps: Secondary | ICD-10-CM

## 2017-02-25 MED ORDER — NA SULFATE-K SULFATE-MG SULF 17.5-3.13-1.6 GM/177ML PO SOLN: 1.0000 | Freq: Once | ORAL | 0 refills | Status: AC

## 2017-02-25 NOTE — Progress Notes (Signed)
No egg or soy allergy known to patient  No issues with past sedation with any surgeries  or procedures, no intubation problems  No diet pills per patient No home 02 use per patient  No blood thinners per patient  Pt denies issues with constipation  No A fib or A flutter  EMMI video sent to pt's e mail  Pt states she has a company coming with her as her care partner and driver- informed her multiple times this person has to come to 4th floor and stay the whole 2-3 hours- she sates she is aware and so is the company she is paying

## 2017-03-11 ENCOUNTER — Other Ambulatory Visit: Payer: Self-pay

## 2017-03-11 ENCOUNTER — Encounter: Payer: Self-pay | Admitting: Internal Medicine

## 2017-03-11 ENCOUNTER — Ambulatory Visit (AMBULATORY_SURGERY_CENTER): Payer: Medicare Other | Admitting: Internal Medicine

## 2017-03-11 VITALS — BP 128/74 | HR 73 | Temp 98.9°F | Resp 16 | Ht 65.0 in | Wt 153.0 lb

## 2017-03-11 DIAGNOSIS — Z86010 Personal history of colonic polyps: Secondary | ICD-10-CM | POA: Diagnosis not present

## 2017-03-11 DIAGNOSIS — D123 Benign neoplasm of transverse colon: Secondary | ICD-10-CM

## 2017-03-11 DIAGNOSIS — D124 Benign neoplasm of descending colon: Secondary | ICD-10-CM | POA: Diagnosis not present

## 2017-03-11 DIAGNOSIS — E669 Obesity, unspecified: Secondary | ICD-10-CM | POA: Diagnosis not present

## 2017-03-11 DIAGNOSIS — D122 Benign neoplasm of ascending colon: Secondary | ICD-10-CM | POA: Diagnosis not present

## 2017-03-11 MED ORDER — SODIUM CHLORIDE 0.9 % IV SOLN: 500.0000 mL | INTRAVENOUS | Status: DC

## 2017-03-11 NOTE — Op Note (Signed)
[facility] Patient Name: Sherry Wilkinson Procedure Date: 03/11/2017 12:07 PM MRN: [id number] Endoscopist: Jerene Bears , MD Age: 71 Referring MD:  Date of Birth: 1946/04/21 Gender: Female Account #: [id number] Procedure:                Colonoscopy Indications:              Surveillance: Personal history of adenomatous                            polyps on last colonoscopy 3 years ago Medicines:                Monitored Anesthesia Care Procedure:                Pre-Anesthesia Assessment:                           - Prior to the procedure, a History and Physical                            was performed, and patient medications and                            allergies were reviewed. The patient's tolerance of                            previous anesthesia was also reviewed. The risks                            and benefits of the procedure and the sedation                            options and risks were discussed with the patient.                            All questions were answered, and informed consent                            was obtained. Prior Anticoagulants: The patient has                            taken no previous anticoagulant or antiplatelet                            agents. ASA Grade Assessment: III - A patient with                            severe systemic disease. After reviewing the risks                            and benefits, the patient was deemed in                            satisfactory condition to undergo the procedure.  After obtaining informed consent, the colonoscope                            was passed under direct vision. Throughout the                            procedure, the patient's blood pressure, pulse, and                            oxygen saturations were monitored continuously. The                            Colonoscope was introduced through the anus and                            advanced to the the  cecum, identified by                            appendiceal orifice and ileocecal valve. The                            colonoscopy was performed without difficulty. The                            patient tolerated the procedure well. The quality                            of the bowel preparation was good. The ileocecal                            valve, appendiceal orifice, and rectum were                            photographed. Scope In: 1:38:09 PM Scope Out: 2:03:03 PM Scope Withdrawal Time: 0 hours 20 minutes 22 seconds  Total Procedure Duration: 0 hours 24 minutes 54 seconds  Findings:                 The digital rectal exam was normal.                           A 3 mm polyp was found in the ascending colon. The                            polyp was sessile. The polyp was removed with a                            cold snare. Resection and retrieval were complete.                           Three sessile polyps were found in the hepatic                            flexure. The polyps were 3 to 4 mm in  size. These                            polyps were removed with a cold snare. Resection                            and retrieval were complete.                           A 3 mm polyp was found in the descending colon. The                            polyp was sessile. The polyp was removed with a                            cold snare. Resection and retrieval were complete.                           Internal hemorrhoids were found during                            retroflexion. The hemorrhoids were medium-sized. Complications:            No immediate complications. Estimated Blood Loss:     Estimated blood loss was minimal. Impression:               - One 3 mm polyp in the ascending colon, removed                            with a cold snare. Resected and retrieved.                           - Three 3 to 4 mm polyps at the hepatic flexure,                            removed with a cold  snare. Resected and retrieved.                           - One 3 mm polyp in the descending colon, removed                            with a cold snare. Resected and retrieved.                           - Internal hemorrhoids. Recommendation:           - Patient has a contact number available for                            emergencies. The signs and symptoms of potential                            delayed complications were discussed with the  patient. Return to normal activities tomorrow.                            Written discharge instructions were provided to the                            patient.                           - Resume previous diet.                           - Continue present medications.                           - Await pathology results.                           - Repeat colonoscopy is recommended for                            surveillance with 2 day prep. The colonoscopy date                            will be determined after pathology results from                            today's exam become available for review. Jerene Bears, MD 03/11/2017 2:09:52 PM This report has been signed electronically.

## 2017-03-11 NOTE — Patient Instructions (Signed)
**  Handouts given on Polyps and Hemorrhoids**   YOU HAD AN ENDOSCOPIC PROCEDURE TODAY: Refer to the procedure report and other information in the discharge instructions given to you for any specific questions about what was found during the examination. If this information does not answer your questions, please call [facility] office at [phone] to clarify.   YOU SHOULD EXPECT: Some feelings of bloating in the abdomen. Passage of more gas than usual. Walking can help get rid of the air that was put into your GI tract during the procedure and reduce the bloating. If you had a lower endoscopy (such as a colonoscopy or flexible sigmoidoscopy) you may notice spotting of blood in your stool or on the toilet paper. Some abdominal soreness may be present for a day or two, also.  DIET: Your first meal following the procedure should be a light meal and then it is ok to progress to your normal diet. A half-sandwich or bowl of soup is an example of a good first meal. Heavy or fried foods are harder to digest and may make you feel nauseous or bloated. Drink plenty of fluids but you should avoid alcoholic beverages for 24 hours. If you had a esophageal dilation, please see attached instructions for diet.    ACTIVITY: Your care partner should take you home directly after the procedure. You should plan to take it easy, moving slowly for the rest of the day. You can resume normal activity the day after the procedure however YOU SHOULD NOT DRIVE, use power tools, machinery or perform tasks that involve climbing or major physical exertion for 24 hours (because of the sedation medicines used during the test).   SYMPTOMS TO REPORT IMMEDIATELY: A gastroenterologist can be reached at any hour. Please call [phone]  for any of the following symptoms:  Following lower endoscopy (colonoscopy, flexible sigmoidoscopy) Excessive amounts of blood in the stool  Significant tenderness, worsening of abdominal pains  Swelling of  the abdomen that is new, acute  Fever of 100 or higher    FOLLOW UP:  If any biopsies were taken you will be contacted by phone or by letter within the next 1-3 weeks. Call [phone]  if you have not heard about the biopsies in 3 weeks.  Please also call with any specific questions about appointments or follow up tests.  

## 2017-03-11 NOTE — Progress Notes (Signed)
Report to PACU, RN, vss, BBS= Clear.  

## 2017-03-11 NOTE — Progress Notes (Signed)
Called to room to assist during endoscopic procedure.  Patient ID and intended procedure confirmed with present staff. Received instructions for my participation in the procedure from the performing physician.  

## 2017-03-12 ENCOUNTER — Telehealth: Payer: Self-pay | Admitting: *Deleted

## 2017-03-12 NOTE — Telephone Encounter (Signed)
  Follow up Call-  Call back number 03/11/2017  Post procedure Call Back phone  # [phone]  Permission to leave phone message Yes  Some recent data might be hidden     Patient questions:  Phone # is busy.

## 2017-03-12 NOTE — Telephone Encounter (Signed)
  Follow up Call-  Call back number 03/11/2017  Post procedure Call Back phone  # [phone]  Permission to leave phone message Yes  Some recent data might be hidden    [facility]

## 2017-03-17 ENCOUNTER — Encounter: Payer: Self-pay | Admitting: Internal Medicine

## 2017-06-27 ENCOUNTER — Other Ambulatory Visit: Payer: Self-pay | Admitting: Family Medicine

## 2017-06-27 DIAGNOSIS — Z1231 Encounter for screening mammogram for malignant neoplasm of breast: Secondary | ICD-10-CM

## 2017-08-07 ENCOUNTER — Ambulatory Visit
Admission: RE | Admit: 2017-08-07 | Discharge: 2017-08-07 | Disposition: A | Discharge status: Home / Self Care | Payer: Medicare Other | Source: Ambulatory Visit | Attending: Family Medicine | Admitting: Family Medicine

## 2017-08-07 DIAGNOSIS — Z1231 Encounter for screening mammogram for malignant neoplasm of breast: Secondary | ICD-10-CM | POA: Diagnosis not present

## 2017-08-19 NOTE — Progress Notes (Signed)
   [facility] Phone: [phone]   Date of Visit: 08/20/2017   HPI:  Evaluation prior to dental procedure: -Patient reports of having a deep cleaning done recently at her dentist's office.  This procedure required multiple injections for numbing in her mouth.  She had her head turned toward the right side and had work done on the right side mainly.  During the procedure she suddenly felt pain in her right neck and right side of her face and head.  Reports she felt like there was pressure behind her right eye.  She did not have any blurred vision, facial drooping, slurred speech, or any focal neurological deficits.  The pain lasted about 30 minutes or so and then self resolved.  She reports of having some right-sided neck pain intermittently over the past few months.  This is usually when she wakes up in the morning.  When she turns her head she sometimes gets a pain in her right neck region for a few seconds then it self resolves.  Her dentist wanted her to be seen at her PCPs clinic prior to any further procedures.  ROS: See HPI.  [place]:  PMH: Osteoporosis  PHYSICAL EXAM: BP 122/66   Pulse 68   Temp 98.4 F (36.9 C) (Oral)   Ht 5\' 5"  (1.651 m)   Wt 156 lb (70.8 kg)   SpO2 99%   BMI 25.96 kg/m  GEN: NAD HEENT: Atraumatic, normocephalic, neck supple without any tenderness or lymphadenopathy., EOMI, PERRL,sclera clear, oropharynx unremarkable, no tenderness to palpation of temples or jaw. CV: RRR, no murmurs, rubs, or gallops PULM: CTAB, normal effort ABD: Soft, nontender, nondistended, NABS, no organomegaly SKIN: No rash or cyanosis; warm and well-perfused EXTR: No lower extremity edema or calf tenderness PSYCH: Mood and affect euthymic, normal rate and volume of speech NEURO: Awake, alert, cranial nerves II through XII intact, normal speech, normal strength in the upper and lower extremities, normal sensation to light touch in the upper and lower  extremities bilaterally.  Normal patellar reflexes.  Normal finger-to-nose testing bilaterally.  Gait is normal   ASSESSMENT/PLAN:  1. Nonintractable episodic headache, unspecified headache type Her symptoms are not consistent with TIA or temporal arteritis.  Possible reaction to the numbing medication that she had during the procedure.  Her intermittent neck pain after she wakes up  appears to be consistent with musculoskeletal neck pain.  I think she can proceed with her dental procedures.  Smiley Houseman, MD PGY [place]

## 2017-08-20 ENCOUNTER — Encounter: Payer: Self-pay | Admitting: Internal Medicine

## 2017-08-20 ENCOUNTER — Other Ambulatory Visit: Payer: Self-pay

## 2017-08-20 ENCOUNTER — Ambulatory Visit (INDEPENDENT_AMBULATORY_CARE_PROVIDER_SITE_OTHER): Payer: Medicare Other | Admitting: Internal Medicine

## 2017-08-20 VITALS — BP 122/66 | HR 68 | Temp 98.4°F | Ht 65.0 in | Wt 156.0 lb

## 2017-08-20 DIAGNOSIS — R51 Headache: Secondary | ICD-10-CM

## 2017-08-20 DIAGNOSIS — R519 Headache, unspecified: Secondary | ICD-10-CM

## 2017-08-20 NOTE — Patient Instructions (Signed)
Thank you for coming.   I think it is okay if you proceed with your dental procedure.   I will fax a letter to your doctor

## 2017-10-10 ENCOUNTER — Other Ambulatory Visit: Payer: Self-pay

## 2017-10-10 ENCOUNTER — Encounter: Payer: Self-pay | Admitting: Family Medicine

## 2017-10-10 ENCOUNTER — Ambulatory Visit (INDEPENDENT_AMBULATORY_CARE_PROVIDER_SITE_OTHER): Payer: Medicare Other | Admitting: Family Medicine

## 2017-10-10 VITALS — BP 126/72 | HR 60 | Temp 98.4°F | Ht 65.0 in | Wt 156.0 lb

## 2017-10-10 DIAGNOSIS — R7309 Other abnormal glucose: Secondary | ICD-10-CM

## 2017-10-10 DIAGNOSIS — Z13228 Encounter for screening for other metabolic disorders: Secondary | ICD-10-CM

## 2017-10-10 DIAGNOSIS — M8580 Other specified disorders of bone density and structure, unspecified site: Secondary | ICD-10-CM | POA: Diagnosis not present

## 2017-10-10 DIAGNOSIS — M810 Age-related osteoporosis without current pathological fracture: Secondary | ICD-10-CM | POA: Diagnosis not present

## 2017-10-10 DIAGNOSIS — E569 Vitamin deficiency, unspecified: Secondary | ICD-10-CM

## 2017-10-10 DIAGNOSIS — R202 Paresthesia of skin: Secondary | ICD-10-CM | POA: Insufficient documentation

## 2017-10-10 DIAGNOSIS — Z114 Encounter for screening for human immunodeficiency virus [HIV]: Secondary | ICD-10-CM

## 2017-10-10 DIAGNOSIS — L608 Other nail disorders: Secondary | ICD-10-CM | POA: Diagnosis not present

## 2017-10-10 DIAGNOSIS — D649 Anemia, unspecified: Secondary | ICD-10-CM | POA: Diagnosis not present

## 2017-10-10 HISTORY — DX: Paresthesia of skin: R20.2

## 2017-10-10 LAB — POCT GLYCOSYLATED HEMOGLOBIN (HGB A1C): Hemoglobin A1C: 5.9 % — AB (ref 4.0–5.6)

## 2017-10-10 NOTE — Patient Instructions (Signed)
Paresthesia Paresthesia is a burning or prickling feeling. This feeling can happen in any part of the body. It often happens in the hands, arms, legs, or feet. Usually, it is not painful. In most cases, the feeling goes away in a short time and is not a sign of a serious problem. Follow these instructions at home:  Avoid drinking alcohol.  Try massage or needle therapy (acupuncture) to help with your problems.  Keep all follow-up visits as told by your doctor. This is important. Contact a doctor if:  You keep on having episodes of paresthesia.  Your burning or prickling feeling gets worse when you walk.  You have pain or cramps.  You feel dizzy.  You have a rash. Get help right away if:  You feel weak.  You have trouble walking or moving.  You have problems speaking, understanding, or seeing.  You feel confused.  You cannot control when you pee (urinate) or poop (bowel movement).  You lose feeling (numbness) after an injury.  You pass out (faint). This information is not intended to replace advice given to you by your health care provider. Make sure you discuss any questions you have with your health care provider. Document Released: 03/21/2008 Document Revised: 09/14/2015 Document Reviewed: 04/04/2014 Elsevier Interactive Patient Education  2018 Elsevier Inc.  

## 2017-10-10 NOTE — Progress Notes (Signed)
poc Subjective:     Patient ID: Sherry Wilkinson, female   DOB: 03-May-1945, 72 y.o.   MRN: [id number]  HPI Big toe pain: B/L big toe tingling sensation. She usually use her toes to clean her bathroom using a lysol solution for more than 1 yrs and since then she has been having symptoms on and off, but now worsening. She endorsed numbness on and off of the tip of her big toes. Denies trauma or injury. No swelling or erythema. There is discoloration of her left big toenail. Osteoporosis: Taking calcium and Vit D. She does not want to take Fosamax.  Current Outpatient Medications on File Prior to Visit  Medication Sig Dispense Refill  . calcium-vitamin D (OSCAL WITH D) 500-200 MG-UNIT tablet Take 1 tablet by mouth 2 (two) times daily. 60 tablet 4  . albuterol (PROVENTIL HFA;VENTOLIN HFA) 108 (90 Base) MCG/ACT inhaler Inhale 2 puffs into the lungs every 6 (six) hours as needed for wheezing or shortness of breath. (Patient not taking: Reported on 08/15/2016) 1 Inhaler 2  . vitamin E 1000 UNIT capsule Take 1,000 Units by mouth daily.     No current facility-administered medications on file prior to visit.    Past Medical History:  Diagnosis Date  . Appendicitis   . Arthritis    all over - feet and knees especially   . Cataract   . Liver cyst   . Osteopenia   . Perineal abscess 12/16/2014   Vitals:   10/10/17 0951  BP: 126/72  Pulse: 60  Temp: 98.4 F (36.9 C)  TempSrc: Oral  SpO2: 99%  Weight: 156 lb (70.8 kg)  Height: 5\' 5"  (1.651 m)     Review of Systems  Respiratory: Negative.   Cardiovascular: Negative.   Gastrointestinal: Negative.   Musculoskeletal:       Toe tingling and toenail discoloration  All other systems reviewed and are negative.      Objective:   Physical Exam  Constitutional: She is oriented to person, place, and time. She appears well-developed. No distress.  Cardiovascular: Normal rate, regular rhythm and normal heart sounds.  No murmur  heard. Pulmonary/Chest: Effort normal. No respiratory distress. She has no wheezes.  Abdominal: Soft. Bowel sounds are normal. She exhibits no distension and no mass. There is no tenderness.  Musculoskeletal:       Right foot: Normal.       Left foot: Normal.       Feet:  Neurological: She is alert and oriented to person, place, and time. She has normal strength. No cranial nerve deficit or sensory deficit. She displays a negative Romberg sign.  Normal monofilament sensory test of her toes and feet.  Nursing note and vitals reviewed.        Assessment:     Paresthesia Osteoporosis Toenail discoloration: ?? Onychomycosis.    Plan:     Check problem list.

## 2017-10-10 NOTE — Assessment & Plan Note (Signed)
Of both big toe. Sensory exam benign. R/O metabolic or endocrine pathology. A1C shows pre DM status. HIV/RPR,Vitamin B12 and D, TSH, Cmet and CBC. I will contact her with result. She had already been to a podiatrist. Will monitor for now pending result.

## 2017-10-10 NOTE — Assessment & Plan Note (Signed)
Left big toenail No sign or symptoms of injury. R/O fungal nail infection. Her nail is too short for clipping today. I encouraged her to grow out her nail and reschedule nail clipping for fungal culture. She agreed with the plan.

## 2017-10-10 NOTE — Assessment & Plan Note (Signed)
Declined Biphosphonate. Continue Calcium and Vitamin D supplement.

## 2017-10-11 LAB — CMP14+EGFR
A/G RATIO: 1.6 (ref 1.2–2.2)
ALBUMIN: 4.4 g/dL (ref 3.5–4.8)
ALT: 10 IU/L (ref 0–32)
AST: 17 IU/L (ref 0–40)
Alkaline Phosphatase: 85 IU/L (ref 39–117)
BUN / CREAT RATIO: 7 — AB (ref 12–28)
BUN: 8 mg/dL (ref 8–27)
Bilirubin Total: 0.3 mg/dL (ref 0.0–1.2)
CALCIUM: 10 mg/dL (ref 8.7–10.3)
CO2: 25 mmol/L (ref 20–29)
Chloride: 106 mmol/L (ref 96–106)
Creatinine, Ser: 1.07 mg/dL — ABNORMAL HIGH (ref 0.57–1.00)
GFR, EST AFRICAN AMERICAN: 60 mL/min/{1.73_m2} (ref >59)
GFR, EST NON AFRICAN AMERICAN: 52 mL/min/{1.73_m2} — AB (ref >59)
Globulin, Total: 2.8 g/dL (ref 1.5–4.5)
Glucose: 100 mg/dL — ABNORMAL HIGH (ref 65–99)
POTASSIUM: 4.2 mmol/L (ref 3.5–5.2)
Sodium: 144 mmol/L (ref 134–144)
TOTAL PROTEIN: 7.2 g/dL (ref 6.0–8.5)

## 2017-10-11 LAB — CBC WITH DIFFERENTIAL/PLATELET
BASOS: 1 %
Basophils Absolute: 0 10*3/uL (ref 0.0–0.2)
EOS (ABSOLUTE): 0.1 10*3/uL (ref 0.0–0.4)
EOS: 1 %
HEMATOCRIT: 33.3 % — AB (ref 34.0–46.6)
HEMOGLOBIN: 10.7 g/dL — AB (ref 11.1–15.9)
IMMATURE GRANS (ABS): 0 10*3/uL (ref 0.0–0.1)
IMMATURE GRANULOCYTES: 0 %
LYMPHS: 25 %
Lymphocytes Absolute: 1.4 10*3/uL (ref 0.7–3.1)
MCH: 25.7 pg — ABNORMAL LOW (ref 26.6–33.0)
MCHC: 32.1 g/dL (ref 31.5–35.7)
MCV: 80 fL (ref 79–97)
MONOCYTES: 10 %
Monocytes Absolute: 0.6 10*3/uL (ref 0.1–0.9)
NEUTROS ABS: 3.5 10*3/uL (ref 1.4–7.0)
NEUTROS PCT: 63 %
Platelets: 375 10*3/uL (ref 150–450)
RBC: 4.17 x10E6/uL (ref 3.77–5.28)
RDW: 16.1 % — ABNORMAL HIGH (ref 12.3–15.4)
WBC: 5.5 10*3/uL (ref 3.4–10.8)

## 2017-10-11 LAB — VITAMIN D 25 HYDROXY (VIT D DEFICIENCY, FRACTURES): Vit D, 25-Hydroxy: 57.8 ng/mL (ref 30.0–100.0)

## 2017-10-11 LAB — TSH: TSH: 3.04 u[IU]/mL (ref 0.450–4.500)

## 2017-10-11 LAB — RPR: RPR: NONREACTIVE

## 2017-10-11 LAB — HIV ANTIBODY (ROUTINE TESTING W REFLEX): HIV SCREEN 4TH GENERATION: NONREACTIVE

## 2017-10-11 LAB — VITAMIN B12

## 2017-10-13 ENCOUNTER — Telehealth: Payer: Self-pay | Admitting: Family Medicine

## 2017-10-13 NOTE — Telephone Encounter (Signed)
Test result discussed with her. Start Ferrous Sulfate and Vitamin B12 supplement. Recheck lab in 6 months.  Note: She is on a vegetarian diet for many years; risk factor for Vitamin B12 deficiency.  Mild AKI: Hydration recommended.

## 2017-10-14 ENCOUNTER — Telehealth: Payer: Self-pay | Admitting: *Deleted

## 2017-10-14 NOTE — Telephone Encounter (Signed)
Unable to reach patient.  Yehuda Printup,CMA

## 2017-10-14 NOTE — Telephone Encounter (Signed)
Tried calling patient but was unable to leave a message for patient.  Will continue to try and reach for her to make an appointment with Dr. Gwendlyn Deutscher since derm clinic is full for the next month.  Jazmin Hartsell,CMA

## 2017-10-14 NOTE — Telephone Encounter (Signed)
-----   Message from Kinnie Feil, MD sent at 10/10/2017 12:25 PM EDT ----- Please help patient schedule f/u for nail clipping either with me or in the Derm clinic.   Thanks.

## 2017-10-15 NOTE — Telephone Encounter (Signed)
Tried calling patient multiple times as well as her emergency contact.  Will wait to hear back from her. Lindsey Demonte,CMA

## 2017-10-28 ENCOUNTER — Encounter: Payer: Self-pay | Admitting: Family Medicine

## 2017-10-28 ENCOUNTER — Ambulatory Visit (INDEPENDENT_AMBULATORY_CARE_PROVIDER_SITE_OTHER): Payer: Medicare Other | Admitting: Family Medicine

## 2017-10-28 ENCOUNTER — Other Ambulatory Visit: Payer: Self-pay

## 2017-10-28 VITALS — BP 118/64 | HR 69 | Temp 98.1°F | Wt 152.0 lb

## 2017-10-28 DIAGNOSIS — B351 Tinea unguium: Secondary | ICD-10-CM

## 2017-10-28 DIAGNOSIS — L608 Other nail disorders: Secondary | ICD-10-CM | POA: Diagnosis present

## 2017-10-28 DIAGNOSIS — E538 Deficiency of other specified B group vitamins: Secondary | ICD-10-CM | POA: Insufficient documentation

## 2017-10-28 NOTE — Progress Notes (Signed)
Patient ID: Sherry Wilkinson, female   DOB: 1945/07/13, 72 y.o.   MRN: [id number] Patient gave me a home/self made baby shawl. She stated it does not cost her to make it. Gift not declined to not hurt her emotions. Patient appreciated.

## 2017-10-28 NOTE — Assessment & Plan Note (Addendum)
Advised to start oral vitamin B12 supplement vs IM injection. She declined injection. Will start oral therapy. Obtain Vit B12 1047mcg PO Qd OTC. Return in 6 months for recheck. She verbalized understanding.

## 2017-10-28 NOTE — Progress Notes (Signed)
  Subjective:     Patient ID: Sherry Wilkinson, female   DOB: 1946-02-03, 72 y.o.   MRN: [id number]  HPI Vitamin B12: Still yet to start supplement. Left toenail discoloration: Here for f/u.  Current Outpatient Medications on File Prior to Visit  Medication Sig Dispense Refill  . calcium-vitamin D (OSCAL WITH D) 500-200 MG-UNIT tablet Take 1 tablet by mouth 2 (two) times daily. 60 tablet 4  . albuterol (PROVENTIL HFA;VENTOLIN HFA) 108 (90 Base) MCG/ACT inhaler Inhale 2 puffs into the lungs every 6 (six) hours as needed for wheezing or shortness of breath. (Patient not taking: Reported on 08/15/2016) 1 Inhaler 2  . vitamin E 1000 UNIT capsule Take 1,000 Units by mouth daily.     No current facility-administered medications on file prior to visit.    Past Medical History:  Diagnosis Date  . Appendicitis   . Arthritis    all over - feet and knees especially   . Cataract   . Liver cyst   . Osteopenia   . Perineal abscess 12/16/2014   Vitals:   10/28/17 0952  BP: 118/64  Pulse: 69  Temp: 98.1 F (36.7 C)  TempSrc: Oral  SpO2: 97%  Weight: 152 lb (68.9 kg)     Review of Systems  Respiratory: Negative.   Cardiovascular: Negative.   Gastrointestinal: Negative.   Skin:       Toenail discoloration  All other systems reviewed and are negative.      Objective:   Physical Exam  Constitutional: She appears well-developed. No distress.  Cardiovascular: Normal rate, regular rhythm and intact distal pulses.  No murmur heard. Pulmonary/Chest: Effort normal and breath sounds normal. No stridor. No respiratory distress. She has no wheezes.  Abdominal: Soft. Bowel sounds are normal. She exhibits no distension. There is no tenderness.  Musculoskeletal:  No change to left big toenail discoloration  Nursing note and vitals reviewed.         Assessment:     Left toe nail discoloration Vitamin B12 deficiency    Plan:     Check problem list. More than 50% of this face to face  30 mins encounter was spent on procedure, counseling and coordination of care.

## 2017-10-28 NOTE — Patient Instructions (Signed)

## 2017-10-28 NOTE — Assessment & Plan Note (Signed)
Toenail clipping done after obtaining verbal informed consent. No complication after clipping. Specimen sent for fungal culture. I will contact her with the result.

## 2017-10-30 ENCOUNTER — Telehealth: Payer: Self-pay | Admitting: Family Medicine

## 2017-10-30 NOTE — Telephone Encounter (Signed)
HIPAA compliant call back message left.   When she calls let her know that she need to come back for nail clipping after she grows out her toenail. The specimen was insufficient for analysis, hence the pathology canceled her toenail fungal culture.

## 2017-10-31 ENCOUNTER — Telehealth: Payer: Self-pay | Admitting: Family Medicine

## 2017-10-31 NOTE — Telephone Encounter (Signed)
I informed her that her toenail specimen was insufficient to run a test on so it was canceled. She is advised to grow out her nail and return soon for testing vs starting oral antifungal and watch for improvement of her symptoms while monitoring the liver function closely. She prefers to get a diagnosis and will return soon for repeat test.

## 2017-11-14 ENCOUNTER — Encounter: Payer: Self-pay | Admitting: Family Medicine

## 2017-11-14 ENCOUNTER — Telehealth: Payer: Self-pay | Admitting: Family Medicine

## 2017-11-14 ENCOUNTER — Telehealth: Payer: Self-pay

## 2017-11-14 NOTE — Telephone Encounter (Signed)
I was unable to leave a message on her phone.    Note:  Fungal culture result.  Previously I got a message that it was canceled due to insufficient specimen. I called [facility] today and they clarified that it was only the fungal smear that was canceled. 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 At this point we can safely initiate treatment.  Please discuss with patient when if she calls back.

## 2017-11-14 NOTE — Telephone Encounter (Signed)
Attempted to contact pt to schedule with pcp. No answer and no option for VM. If she calls back please schedule her with [place] next week.

## 2017-11-14 NOTE — Telephone Encounter (Signed)
-----   Message from Kinnie Feil, MD sent at 11/14/2017  3:17 PM EDT ----- Please help patient schedule follow-up appointment with me to discuss fungal culture result. Thanks.

## 2017-11-14 NOTE — Progress Notes (Signed)
Attempt made to reach patient regarding fungal culture on multiple occasions without success. I was unable to leave a message. I will mail her a result letter.

## 2017-11-17 ENCOUNTER — Institutional Professional Consult (permissible substitution): Payer: Medicare Other | Admitting: Pulmonary Disease

## 2017-11-20 LAB — FUNGUS CULTURE W SMEAR

## 2017-11-21 ENCOUNTER — Other Ambulatory Visit: Payer: Self-pay | Admitting: Family Medicine

## 2017-11-21 MED ORDER — CICLOPIROX 8 % EX SOLN: Freq: Every day | CUTANEOUS | 5 refills | Status: DC

## 2017-11-21 NOTE — Telephone Encounter (Signed)
Pt called nurse line asking to speak with PCP. I attempted to schedule her, per last notes in chart, she refused. Pt stated she will call pcp back on Monday.

## 2017-11-21 NOTE — Telephone Encounter (Signed)
She did not pick up. HIPAA compliant message left for her to call back. She need to leave the best number and the best time to call her when she calls back.

## 2017-11-21 NOTE — Telephone Encounter (Addendum)
I called and discussed fungal culture result with her. She stated that she already started OTC med which is helping. She declined oral regimen. She will try another topical agent. She asked that I Escribe her med.  She wants me to send her prescription to [facility]

## 2017-11-21 NOTE — Telephone Encounter (Signed)
Patient left message that she is at home now to receive call. Left number of [phone].  Danley Danker, RN [facility] RN)

## 2017-11-26 MED ORDER — CICLOPIROX 8 % EX SOLN: CUTANEOUS | 5 refills | Status: DC

## 2017-11-26 NOTE — Telephone Encounter (Signed)
Please resend Ciclopirox prescription. It was set on print.  Danley Danker, RN [facility] RN)

## 2017-11-26 NOTE — Addendum Note (Signed)
Addended by: Esau Grew on: 11/26/2017 10:10 AM   Modules accepted: Orders

## 2017-12-20 ENCOUNTER — Encounter (HOSPITAL_COMMUNITY): Payer: Self-pay | Admitting: Emergency Medicine

## 2017-12-20 ENCOUNTER — Emergency Department (HOSPITAL_COMMUNITY): Payer: Medicare Other

## 2017-12-20 ENCOUNTER — Emergency Department (HOSPITAL_COMMUNITY)
Admission: EM | Admit: 2017-12-20 | Discharge: 2017-12-20 | Disposition: A | Discharge status: Home / Self Care | Payer: Medicare Other | Attending: Emergency Medicine | Admitting: Emergency Medicine

## 2017-12-20 DIAGNOSIS — M25562 Pain in left knee: Secondary | ICD-10-CM | POA: Diagnosis not present

## 2017-12-20 DIAGNOSIS — Z79899 Other long term (current) drug therapy: Secondary | ICD-10-CM | POA: Diagnosis not present

## 2017-12-20 DIAGNOSIS — S8992XA Unspecified injury of left lower leg, initial encounter: Secondary | ICD-10-CM | POA: Diagnosis not present

## 2017-12-20 NOTE — ED Notes (Signed)
Patient able to ambulate independently  

## 2017-12-20 NOTE — ED Provider Notes (Signed)
[facility] EMERGENCY DEPARTMENT Provider Note   CSN: [id number] Arrival date & time: 12/20/17  0957     History   Chief Complaint Chief Complaint  Patient presents with  . Knee Pain    left     HPI Sherry Wilkinson is a 72 y.o. female presenting for 1.5 weeks of left knee pain.  Patient states that she was walking when her foot got caught on a chair and she twisted her left knee.  Patient states that she has been having a 5/10 in severity aching pain to the left knee that is worsened with palpation and ambulation.  Patient states that she is used peppermint oil for her pain without relief.  Patient denies fever, color change, swelling, numbness/tingling or weakness.  Patient denies history of kidney disease or gastric bleeding or liver disease.  Patient denies fall. HPI  Past Medical History:  Diagnosis Date  . Appendicitis   . Arthritis    all over - feet and knees especially   . Cataract   . Liver cyst   . Osteopenia   . Perineal abscess 12/16/2014    Patient Active Problem List   Diagnosis Date Noted  . Vitamin B 12 deficiency 10/28/2017  . Paresthesia 10/10/2017  . Nail discoloration 10/10/2017  . Constipation 01/24/2017  . Hemorrhoid 01/24/2017  . Osteoporosis 08/13/2016  . Liver cyst 08/04/2012    Past Surgical History:  Procedure Laterality Date  . APPENDECTOMY  1970  . COLONOSCOPY    . POLYPECTOMY    . UTERINE FIBROID SURGERY    . WRIST SURGERY       OB History    Gravida  3   Para  0   Term  0   Preterm  0   AB  3   Living        SAB  3   TAB  0   Ectopic      Multiple      Live Births               Home Medications    Prior to Admission medications   Medication Sig Start Date End Date Taking? Authorizing Provider  albuterol (PROVENTIL HFA;VENTOLIN HFA) 108 (90 Base) MCG/ACT inhaler Inhale 2 puffs into the lungs every 6 (six) hours as needed for wheezing or shortness of breath. Patient not taking:  Reported on 08/15/2016 08/13/16   Kinnie Feil, MD  calcium-vitamin D (OSCAL WITH D) 500-200 MG-UNIT tablet Take 1 tablet by mouth 2 (two) times daily. 11/30/15   Kinnie Feil, MD  ciclopirox (PENLAC) 8 % solution Apply over nail and surrounding skin. Apply daily over previous coat. After seven (7) days, may remove with alcohol and continue cycle. 11/26/17   Kinnie Feil, MD  vitamin E 1000 UNIT capsule Take 1,000 Units by mouth daily.    [provider]    Family History Family History  Problem Relation Age of Onset  . Heart disease Mother   . Heart disease Maternal Grandmother   . Colon cancer Neg Hx   . Esophageal cancer Neg Hx   . Rectal cancer Neg Hx   . Stomach cancer Neg Hx   . Colon polyps Neg Hx     Social History Social History   Tobacco Use  . Smoking status: Never Smoker  . Smokeless tobacco: Never Used  Substance Use Topics  . Alcohol use: No  . Drug use: No     Allergies  Patient has no known allergies.   Review of Systems Review of Systems  Constitutional: Negative.  Negative for chills, fatigue and fever.  Musculoskeletal: Positive for arthralgias. Negative for back pain, joint swelling and neck pain.  Skin: Negative.  Negative for color change and wound.  Neurological: Negative.  Negative for dizziness, syncope, weakness, light-headedness, numbness and headaches.     Physical Exam Updated Vital Signs BP 125/72 (BP Location: Right Arm)   Pulse 60   Temp 98.6 F (37 C)   Resp 20   SpO2 100%   Physical Exam  Constitutional: She is oriented to person, place, and time. She appears well-developed and well-nourished. No distress.  HENT:  Head: Normocephalic and atraumatic.  Right Ear: External ear normal.  Left Ear: External ear normal.  Nose: Nose normal.  Eyes: Pupils are equal, round, and reactive to light. EOM are normal.  Neck: Trachea normal and normal range of motion. No tracheal deviation present.  Cardiovascular:    Pulses:      Dorsalis pedis pulses are 2+ on the right side, and 2+ on the left side.       Posterior tibial pulses are 2+ on the right side, and 2+ on the left side.  Pulmonary/Chest: Effort normal. No respiratory distress.  Abdominal: Soft. There is no tenderness. There is no rebound and no guarding.  Musculoskeletal: Normal range of motion.       Right knee: Normal.       Left knee: She exhibits normal range of motion, no swelling, no effusion, no ecchymosis, no deformity and normal patellar mobility. Tenderness found. Medial joint line and lateral joint line tenderness noted.       Right ankle: Normal.       Left ankle: Normal.       Right upper leg: Normal.       Left upper leg: Normal.       Right lower leg: Normal.       Left lower leg: Normal.       Legs:      Right foot: Normal.       Left foot: Normal.  Left Knee:   Appearance normal. No obvious deformity. No skin swelling, erythema, heat, fluctuance or break of the skin.  Tenderness to palpation over medial and lateral joint lines.  Active and passive flexion and extension intact with some increased pain. No crepitus. Negative Lachman's test. Negative anterior/poster drawer bilaterally.  Negative ballottement test. No varus or valgus laxity or locking. No tenderness to palpation of hips or ankles.Compartments soft. Neurovascularly intact distally to site of injury.   Feet:  Right Foot:  Protective Sensation: 3 sites tested. 3 sites sensed.  Left Foot:  Protective Sensation: 3 sites tested. 3 sites sensed.  Neurological: She is alert and oriented to person, place, and time. She has normal strength. No sensory deficit.  Skin: Skin is warm and dry. Capillary refill takes less than 2 seconds.  Psychiatric: She has a normal mood and affect. Her behavior is normal.     ED Treatments / Results  Labs (all labs ordered are listed, but only abnormal results are displayed) Labs Reviewed - No data to  display  EKG None  Radiology Dg Knee Complete 4 Views Left  Result Date: 12/20/2017 CLINICAL DATA:  Patient reports twisting her left knee about 1 week 1/2 ago. Reports pain around her entire knee, pain when bearing weight. EXAM: LEFT KNEE - COMPLETE 4+ VIEW COMPARISON:  None. FINDINGS: No fracture. Knee  joint normally spaced and aligned.  No arthropathic changes. There is a density that projects along the superior, dorsal margin of the patella on the lateral view consistent with intra-articular calcification. No joint effusion. Soft tissues otherwise unremarkable. IMPRESSION: 1. No fracture or dislocation. 2. Apparent intra-articular calcified body projecting adjacent to the dorsal superior patella on the lateral view. No arthropathic joint changes. No joint effusion. Electronically Signed   By: Lajean Manes M.D.   On: 12/20/2017 11:16    Procedures Procedures (including critical care time)  Medications Ordered in ED Medications - No data to display   Initial Impression / Assessment and Plan / ED Course  I have reviewed the triage vital signs and the nursing notes.  Pertinent labs & imaging results that were available during my care of the patient were reviewed by me and considered in my medical decision making (see chart for details).    Patient refused pain medication here department, states that she wishes to treat with over-the-counter anti-inflammatories at home.  Patient presenting with knee pain, imaging negative for acute findings.  Calcification discussed with Dr. Roderic Palau who advises discharge and orthopedic follow-up.  Calcification discussed with patient at length.  Patient informed that she may use over-the-counter anti-inflammatory medications as directed on the packaging for her pain.  Patient given knee sleeve here department.  Patient encouraged to use rest ice compression elevation to help with her pain.  Patient is neurovascularly intact distal to her knee.  Patient is  ambulatory with no difficulty.  Patient is afebrile, not tachycardic, doubt infectious etiology of her pain.  All compartments are soft.  Patient given orthopedic referral.  At this time there does not appear to be any evidence of an acute emergency medical condition and the patient appears stable for discharge with appropriate outpatient follow up. Diagnosis was discussed with patient who verbalizes understanding of care plan and is agreeable to discharge. I have discussed return precautions with patient who verbalizes understanding of return precautions. Patient strongly encouraged to follow-up with their PCP. All questions answered.  Patient's case discussed with Dr. Roderic Palau who agrees with plan to discharge with follow-up.     Note: Portions of this report may have been transcribed using voice recognition software. Every effort was made to ensure accuracy; however, inadvertent computerized transcription errors may still be present.   Final Clinical Impressions(s) / ED Diagnoses   Final diagnoses:  Acute pain of left knee    ED Discharge Orders    None       Gari Crown 12/20/17 1747    Milton Ferguson, MD 12/21/17 [phone]

## 2017-12-20 NOTE — Discharge Instructions (Addendum)
Please return to the Emergency Department for any new or worsening symptoms or if your symptoms do not improve. Please be sure to follow up with your Primary Care Physician as soon as possible regarding your visit today. If you do not have a Primary Doctor please use the resources below to establish one. Please follow-up with the orthopedic doctor on your discharge paperwork for further evaluation of your knee pain. You may use over-the-counter anti-inflammatories such as Tylenol or ibuprofen as directed on the packaging for your pain. Use rest, ice, compression and elevation to help with your pain. Your blood pressure was elevated today, please be sure to follow-up with your primary care doctor regarding this as well.  Contact a doctor if: The pain does not stop. The pain changes or gets worse. You have a fever along with knee pain. Your knee gives out or locks up. Your knee swells, and becomes worse. Get help right away if: Your knee feels warm. You cannot move your knee. You have very bad knee pain. You have chest pain. You have trouble breathing.  RESOURCE GUIDE  Chronic Pain Problems: Contact [facility]  [phone] Patients need to be referred by their primary care doctor.  Insufficient Money for Medicine: Contact [employer]:  call "211" or [place] [phone].  No Primary Care Doctor: Call [facility]  [phone] - can help you locate a primary care doctor that  accepts your insurance, provides certain services, etc. Physician Referral Service- [phone]  Agencies that provide inexpensive medical care: [facility]  [facility]  [phone] [facility]  [phone] [facility]  [phone] Planned Parenthood  [phone] [facility]  [phone]  [place] Providers: [facility]- [address], [address]  [phone], Mon-Fri  9am-7pm, Sat 9am-1pm [place], [address] [place]  [place], [address] [place]  [place]- [address]  [place], [address], [phone]  Only accepts [place] patients after they have their name  applied to their card  Self Pay (no insurance) in [facility]: Sickle Cell Patients: Dr Kevan Ny, [facility]  [place], [facility]- [place]  [facility] [place]- [address], [facility]- see information above (Speak to [employer] if you do not have insurance)       -  [facility]- [place], [place],  [place]       -  [place] [address], [phone]       -  Dr Vista Lawman-  [address], [place], [facility]- [address], [phone]       -  [facility] [place]- [address] [place], [place], also [address], [phone]       -    [facility]- [address], [place], 1st & 3rd Saturday   every month, 10am-1pm  1) Find a  Doctor and Pay Out of Pocket Although you won't have to find out who is covered by your insurance plan, it is a good idea to ask around and get recommendations. You will then need to call the office and see if the doctor you have chosen will accept you as a new patient and what types of options they offer for patients who are self-pay. Some doctors offer discounts or will set up payment plans for their patients who do not have insurance, but you will need to ask so you aren't surprised when you get to your appointment.  2) Contact Your Local Health Department Not all health departments have doctors that can see patients for  sick visits, but many do, so it is worth a call to see if yours does. If you don't know where your local health department is, you can check in your phone book. The CDC also has a tool to help you locate your state's health department, and many state websites also have listings of all of their local health departments.  3) Find a [place] [facility] If your illness is not likely to be very severe or complicated, you may want to try a walk in clinic. These are popping up all over the country in pharmacies, drugstores, and shopping centers. They're usually staffed by nurse practitioners or physician assistants that have been trained to treat common illnesses and complaints. They're usually fairly quick and inexpensive. However, if you have serious medical issues or chronic medical problems, these are probably not your best option  [facility] [place], [facility], [address], [place], phone [phone] or [phone].  Monday - Friday, call for an appointment. [place], [facility], [address], [place], phone [phone] or [phone].  Monday - Friday, call for an appointment.  Abuse/Neglect: [place] [phone] [place] [phone] (After Hours)  Emergency Shelter:  [facility] [phone]  Maternity Homes: Room at the [place] [phone] [place] [phone]  MRSA Hotline #:   6800373176  [facility] Dept. [address]         [place] Phone:  [phone]                                  Phone:  [phone]                   Phone:   [place], [place] in [place], [address]  [phone], [place] [phone] or [phone] (After Hours)   [place]  Substance Abuse Resources: Alcohol and Drug Services  [place] [phone] [address] Chinita Pester [phone] Residential & Outpatient Substance Abuse Program  [phone]  Psychological Services: [place]  [phone] [place]  [place], [place] [address], [place], [place]: [phone] or [phone], [employer]  Dental Assistance  If unable to pay or uninsured, contact:  [facility] or [facility] to become qualified for the adult dental clinic.  Patients with Medicaid: [facility] [phone] [address], [place] [address] [phone]  If unable to pay, or uninsured, contact [facility] [phone]) or [place] [phone] in [place], [place] in [facility]) to become qualified for the adult dental clinic   Other [place]- [place], [place], [place], [address], [place], [place], 2nd and 4th Thursday of the month at 6:30am.  10 clients each day by appointment, can sometimes see walk-in patients if someone does not show for an appointment. [facility]- [address] Hillard Danker [place], [place], [address], [place], [place], [place], [address], [place] Department- [phone] [facility] [phone]

## 2017-12-20 NOTE — ED Triage Notes (Signed)
Pt sates about a week ago she got her left knee stuck on a chair, the pain has progressively gotten worse. Pt has arthritis in this knee as well.

## 2018-04-28 ENCOUNTER — Encounter: Payer: Self-pay | Admitting: Family Medicine

## 2018-04-28 ENCOUNTER — Ambulatory Visit (INDEPENDENT_AMBULATORY_CARE_PROVIDER_SITE_OTHER): Payer: Medicare Other | Admitting: Family Medicine

## 2018-04-28 ENCOUNTER — Other Ambulatory Visit: Payer: Self-pay

## 2018-04-28 VITALS — BP 110/60 | HR 74 | Temp 98.1°F | Ht 65.0 in | Wt 144.0 lb

## 2018-04-28 DIAGNOSIS — R7309 Other abnormal glucose: Secondary | ICD-10-CM | POA: Diagnosis not present

## 2018-04-28 DIAGNOSIS — E8889 Other specified metabolic disorders: Secondary | ICD-10-CM | POA: Diagnosis not present

## 2018-04-28 DIAGNOSIS — Z0000 Encounter for general adult medical examination without abnormal findings: Secondary | ICD-10-CM

## 2018-04-28 DIAGNOSIS — E785 Hyperlipidemia, unspecified: Secondary | ICD-10-CM | POA: Diagnosis not present

## 2018-04-28 DIAGNOSIS — D649 Anemia, unspecified: Secondary | ICD-10-CM | POA: Diagnosis not present

## 2018-04-28 DIAGNOSIS — E538 Deficiency of other specified B group vitamins: Secondary | ICD-10-CM

## 2018-04-28 LAB — POCT GLYCOSYLATED HEMOGLOBIN (HGB A1C): HEMOGLOBIN A1C: 5.5 % (ref 4.0–5.6)

## 2018-04-28 NOTE — Patient Instructions (Signed)
Anemia  Anemia is a condition in which you do not have enough red blood cells or hemoglobin. Hemoglobin is a substance in red blood cells that carries oxygen. When you do not have enough red blood cells or hemoglobin (are anemic), your body cannot get enough oxygen and your organs may not work properly. As a result, you may feel very tired or have other problems. What are the causes? Common causes of anemia include:  Excessive bleeding. Anemia can be caused by excessive bleeding inside or outside the body, including bleeding from the intestine or from periods in women.  Poor nutrition.  Long-lasting (chronic) kidney, thyroid, and liver disease.  Bone marrow disorders.  Cancer and treatments for cancer.  HIV (human immunodeficiency virus) and AIDS (acquired immunodeficiency syndrome).  Treatments for HIV and AIDS.  Spleen problems.  Blood disorders.  Infections, medicines, and autoimmune disorders that destroy red blood cells. What are the signs or symptoms? Symptoms of this condition include:  Minor weakness.  Dizziness.  Headache.  Feeling heartbeats that are irregular or faster than normal (palpitations).  Shortness of breath, especially with exercise.  Paleness.  Cold sensitivity.  Indigestion.  Nausea.  Difficulty sleeping.  Difficulty concentrating. Symptoms may occur suddenly or develop slowly. If your anemia is mild, you may not have symptoms. How is this diagnosed? This condition is diagnosed based on:  Blood tests.  Your medical history.  A physical exam.  Bone marrow biopsy. Your health care provider may also check your stool (feces) for blood and may do additional testing to look for the cause of your bleeding. You may also have other tests, including:  Imaging tests, such as a CT scan or MRI.  Endoscopy.  Colonoscopy. How is this treated? Treatment for this condition depends on the cause. If you continue to lose a lot of blood, you may  need to be treated at a hospital. Treatment may include:  Taking supplements of iron, vitamin S31, or folic acid.  Taking a hormone medicine (erythropoietin) that can help to stimulate red blood cell growth.  Having a blood transfusion. This may be needed if you lose a lot of blood.  Making changes to your diet.  Having surgery to remove your spleen. Follow these instructions at home:  Take over-the-counter and prescription medicines only as told by your health care provider.  Take supplements only as told by your health care provider.  Follow any diet instructions that you were given.  Keep all follow-up visits as told by your health care provider. This is important. Contact a health care provider if:  You develop new bleeding anywhere in the body. Get help right away if:  You are very weak.  You are short of breath.  You have pain in your abdomen or chest.  You are dizzy or feel faint.  You have trouble concentrating.  You have bloody or black, tarry stools.  You vomit repeatedly or you vomit up blood. Summary  Anemia is a condition in which you do not have enough red blood cells or enough of a substance in your red blood cells that carries oxygen (hemoglobin).  Symptoms may occur suddenly or develop slowly.  If your anemia is mild, you may not have symptoms.  This condition is diagnosed with blood tests as well as a medical history and physical exam. Other tests may be needed.  Treatment for this condition depends on the cause of the anemia. This information is not intended to replace advice given to you by  your health care provider. Make sure you discuss any questions you have with your health care provider. Document Released: 05/16/2004 Document Revised: 05/10/2016 Document Reviewed: 05/10/2016 Elsevier Interactive Patient Education  2019 [employer].

## 2018-04-28 NOTE — Progress Notes (Signed)
Patient ID: Sherry Wilkinson, female   DOB: Sep 27, 1945, 73 y.o.   MRN: [id number] Subjective:     Sherry Wilkinson is a 73 y.o. female and is here for a comprehensive physical exam. The patient reports no problems.  Social History   Socioeconomic History  . Marital status: Divorced    Spouse name: Not on file  . Number of children: Not on file  . Years of education: Not on file  . Highest education level: Not on file  Occupational History  . Not on file  Social Needs  . Financial resource strain: Not on file  . Food insecurity:    Worry: Not on file    Inability: Not on file  . Transportation needs:    Medical: Not on file    Non-medical: Not on file  Tobacco Use  . Smoking status: Never Smoker  . Smokeless tobacco: Never Used  Substance and Sexual Activity  . Alcohol use: No  . Drug use: No  . Sexual activity: Never  Lifestyle  . Physical activity:    Days per week: Not on file    Minutes per session: Not on file  . Stress: Not on file  Relationships  . Social connections:    Talks on phone: Not on file    Gets together: Not on file    Attends religious service: Not on file    Active member of club or organization: Not on file    Attends meetings of clubs or organizations: Not on file    Relationship status: Not on file  . Intimate partner violence:    Fear of current or ex partner: Not on file    Emotionally abused: Not on file    Physically abused: Not on file    Forced sexual activity: Not on file  Other Topics Concern  . Not on file  Social History Narrative  . Not on file   Health Maintenance  Topic Date Due  . TETANUS/TDAP  04/09/1964  . PNA vac Low Risk Adult (1 of 2 - PCV13) 04/09/2010  . INFLUENZA VACCINE  11/20/2017  . PAP SMEAR-Modifier  01/24/2018  . MAMMOGRAM  08/08/2019  . COLONOSCOPY  03/11/2020  . DEXA SCAN  Completed  . Hepatitis C Screening  Completed    The following portions of the patient's history were reviewed and updated as  appropriate: allergies, current medications, past family history, past medical history, past social history, past surgical history and problem list.  Review of Systems Pertinent items noted in HPI and remainder of comprehensive ROS otherwise negative.   Objective:    BP 110/60   Pulse 74   Temp 98.1 F (36.7 C) (Oral)   Ht 5\' 5"  (1.651 m)   Wt 144 lb (65.3 kg)   BMI 23.96 kg/m  General appearance: alert, cooperative and appears stated age Head: Normocephalic, without obvious abnormality, atraumatic Eyes: conjunctivae/corneas clear. PERRL, EOM's intact. Fundi benign. Ears: normal TM's and external ear canals both ears Throat: lips, mucosa, and tongue normal; teeth and gums normal Neck: no adenopathy, no carotid bruit, no JVD, supple, symmetrical, trachea midline and thyroid not enlarged, symmetric, no tenderness/mass/nodules Lungs: clear to auscultation bilaterally Heart: regular rate and rhythm, S1, S2 normal, no murmur, click, rub or gallop Abdomen: soft, non-tender; bowel sounds normal; no masses,  no organomegaly Pelvic: Defered  Extremities: extremities normal, atraumatic, no cyanosis or edema Pulses: 2+ and symmetric. Left big toe nail horizontal yellowish discoloration at the tip of the nail Skin: Skin  color, texture, turgor normal. No rashes or lesions Neurologic: Alert and oriented X 3, normal strength and tone. Normal symmetric reflexes. Normal coordination and gait Motor: grossly normal    Assessment:    Healthy female exam.      Plan:  She declined Tdap and Pneumovax. She will contact GI for colonoscopy f/u. She is still interested in repeat PAP. She is however not due till 2023. Check Anemia Panel today for anemia. A1C looks good. She was prediabetic previously. F/U in 6 months for routine health care.

## 2018-04-29 ENCOUNTER — Telehealth: Payer: Self-pay | Admitting: Family Medicine

## 2018-04-29 LAB — ANEMIA PROFILE B
BASOS ABS: 0.1 10*3/uL (ref 0.0–0.2)
BASOS: 1 %
EOS (ABSOLUTE): 0.1 10*3/uL (ref 0.0–0.4)
Eos: 1 %
FERRITIN: 11 ng/mL — AB (ref 15–150)
Folate: 11.3 ng/mL (ref >3.0)
HEMATOCRIT: 33.2 % — AB (ref 34.0–46.6)
Hemoglobin: 10.6 g/dL — ABNORMAL LOW (ref 11.1–15.9)
Immature Grans (Abs): 0 10*3/uL (ref 0.0–0.1)
Immature Granulocytes: 1 %
Iron Saturation: 6 % — CL (ref 15–55)
Iron: 21 ug/dL — ABNORMAL LOW (ref 27–139)
LYMPHS: 24 %
Lymphocytes Absolute: 1.4 10*3/uL (ref 0.7–3.1)
MCH: 26.6 pg (ref 26.6–33.0)
MCHC: 31.9 g/dL (ref 31.5–35.7)
MCV: 83 fL (ref 79–97)
MONOCYTES: 9 %
MONOS ABS: 0.5 10*3/uL (ref 0.1–0.9)
Neutrophils Absolute: 3.7 10*3/uL (ref 1.4–7.0)
Neutrophils: 64 %
Platelets: 360 10*3/uL (ref 150–450)
RBC: 3.99 x10E6/uL (ref 3.77–5.28)
RDW: 15.6 % — ABNORMAL HIGH (ref 11.7–15.4)
Retic Ct Pct: 1 % (ref 0.6–2.6)
TIBC: 335 ug/dL (ref 250–450)
UIBC: 314 ug/dL (ref 118–369)
VITAMIN B 12: 106 pg/mL — AB (ref 232–1245)
WBC: 5.7 10*3/uL (ref 3.4–10.8)

## 2018-04-29 LAB — LIPID PANEL
Chol/HDL Ratio: 2.7 ratio (ref 0.0–4.4)
Cholesterol, Total: 181 mg/dL (ref 100–199)
HDL: 68 mg/dL (ref >39)
LDL Calculated: 93 mg/dL (ref 0–99)
Triglycerides: 99 mg/dL (ref 0–149)
VLDL Cholesterol Cal: 20 mg/dL (ref 5–40)

## 2018-04-29 NOTE — Telephone Encounter (Signed)
HIPAA compliant callback message left. 

## 2018-04-30 ENCOUNTER — Telehealth: Payer: Self-pay | Admitting: Family Medicine

## 2018-04-30 NOTE — Telephone Encounter (Signed)
Test result discussed with the patient. I recommended Iron supplement TID and B12 injection.  She stated that she will not get any injection. She prefers oral treatment.  I offered to escribe her meds to the pharmacy. She stated that she gets her supplement at a different store at a cheaper price.  I recommended repeat test in 8 weeks. She verbalizes understanding.

## 2018-04-30 NOTE — Telephone Encounter (Signed)
Patient left message she was returning call to PCP. Stated she would try to call back or a letter could be mailed with the information.  Her call back is [phone]  Danley Danker, RN [facility] RN)

## 2018-05-13 ENCOUNTER — Telehealth: Payer: Self-pay | Admitting: Family Medicine

## 2018-05-13 NOTE — Telephone Encounter (Signed)
PT is requesting for her Lab results. Please call the pt back at [phone]. ad

## 2018-05-14 NOTE — Telephone Encounter (Signed)
Spoke with patient and she is requesting a hard copy of her lab results from 04-28-2018.  Labs printed and mailed to patient. Jazmin Hartsell,CMA

## 2018-06-30 ENCOUNTER — Other Ambulatory Visit: Payer: Self-pay | Admitting: Family Medicine

## 2018-06-30 DIAGNOSIS — Z1231 Encounter for screening mammogram for malignant neoplasm of breast: Secondary | ICD-10-CM

## 2018-08-10 ENCOUNTER — Ambulatory Visit: Payer: Medicare Other

## 2018-08-21 ENCOUNTER — Encounter: Payer: Self-pay | Admitting: Family Medicine

## 2018-08-21 ENCOUNTER — Ambulatory Visit (INDEPENDENT_AMBULATORY_CARE_PROVIDER_SITE_OTHER): Payer: Medicare Other | Admitting: Family Medicine

## 2018-08-21 ENCOUNTER — Other Ambulatory Visit: Payer: Self-pay

## 2018-08-21 VITALS — BP 124/68 | HR 78

## 2018-08-21 DIAGNOSIS — Z87891 Personal history of nicotine dependence: Secondary | ICD-10-CM | POA: Diagnosis not present

## 2018-08-21 DIAGNOSIS — D649 Anemia, unspecified: Secondary | ICD-10-CM | POA: Diagnosis not present

## 2018-08-21 DIAGNOSIS — R079 Chest pain, unspecified: Secondary | ICD-10-CM | POA: Diagnosis not present

## 2018-08-21 NOTE — Assessment & Plan Note (Signed)
Combined iron and vitamin b12 deficiency. Taking supplement. Repeat labs today.

## 2018-08-21 NOTE — Assessment & Plan Note (Addendum)
Atypical presentation. Etiology unclear. Obtain CT chest due to hx of tobacco use. I referred her to cardiology for evaluation as well. She currently does not have a chest pain.

## 2018-08-21 NOTE — Progress Notes (Signed)
Subjective:     Patient ID: Sherry Wilkinson, female   DOB: 12-02-1945, 72 y.o.   MRN: [id number]  Chest Pain   This is a new (Feels like a sharp pins and needle pain inside her chest coming from her lungs) problem. The current episode started more than 1 month ago (Started in March). The onset quality is sudden. The problem occurs intermittently. The problem has been gradually improving (Last episode was 4 days ago). The pain is at a severity of 0/10 (Whenever she has the pain it is about 10/10 in severity). The pain is moderate. Quality: pain feels like pressure, tingling and sharpness. At times she feels some coldness or warmth in her chest. The pain does not radiate. Pertinent negatives include no exertional chest pressure, irregular heartbeat, nausea, orthopnea, palpitations, PND, shortness of breath or syncope. Associated symptoms comments: She coughs intermittently. Currently not coughing. At times she will have headache and feel dizzy whenever she has the symptoms. Denies anxiety. The pain is aggravated by nothing. She has tried rest for the symptoms. The treatment provided moderate relief. Risk factors include being elderly, post-menopausal and smoking/tobacco exposure (Quit smoking more than 20 years ago).  Anemia: HM: Due to pneumonia and Tdap shots. Also require f/u with GI for colonoscopy.   Current Outpatient Medications on File Prior to Visit  Medication Sig Dispense Refill  . calcium-vitamin D (OSCAL WITH D) 500-200 MG-UNIT tablet Take 1 tablet by mouth 2 (two) times daily. (Patient not taking: Reported on 04/28/2018) 60 tablet 4  . ciclopirox (PENLAC) 8 % solution Apply over nail and surrounding skin. Apply daily over previous coat. After seven (7) days, may remove with alcohol and continue cycle. (Patient not taking: Reported on 04/28/2018) 6.6 mL 5  . vitamin E 1000 UNIT capsule Take 1,000 Units by mouth daily.     No current facility-administered medications on file prior to visit.     Past Medical History:  Diagnosis Date  . Appendicitis   . Arthritis    all over - feet and knees especially   . Cataract   . Liver cyst   . Osteopenia   . Perineal abscess 12/16/2014     Review of Systems  Respiratory: Negative.  Negative for shortness of breath.   Cardiovascular: Positive for chest pain. Negative for palpitations, orthopnea, syncope and PND.  Gastrointestinal: Negative.  Negative for nausea.  Genitourinary: Negative.   All other systems reviewed and are negative.      Objective:   Physical Exam Vitals signs and nursing note reviewed.  Constitutional:      Appearance: Normal appearance. She is not ill-appearing or toxic-appearing.  Neck:     Musculoskeletal: Normal range of motion.  Cardiovascular:     Rate and Rhythm: Normal rate and regular rhythm.     Pulses: Normal pulses.     Heart sounds: No murmur.  Pulmonary:     Effort: Pulmonary effort is normal. No respiratory distress.     Breath sounds: Normal breath sounds. No stridor. No wheezing or rhonchi.  Abdominal:     General: Abdomen is flat. Bowel sounds are normal.     Palpations: Abdomen is soft.     Tenderness: There is no abdominal tenderness.  Neurological:     Mental Status: She is alert.        Assessment:     Chest pain Anemia  Health maintenance    Plan:     Check problem list. For health maintenance, she declined vaccination. She  contacted her GI's office and was advised to schedule colonoscopy in 2021.

## 2018-08-21 NOTE — Patient Instructions (Signed)
  Please go to the ED should you start having chest pain. I will obtain a CT of your chest and refer you to a cardiologist. Chest Wall Pain Chest wall pain is pain in or around the bones and muscles of your chest. Chest wall pain may be caused by:  An injury.  Coughing a lot.  Using your chest and arm muscles too much. Sometimes, the cause may not be known. This pain may take a few weeks or longer to get better. Follow these instructions at home: Managing pain, stiffness, and swelling If told, put ice on the painful area:  Put ice in a plastic bag.  Place a towel between your skin and the bag.  Leave the ice on for 20 minutes, 2-3 times a day.  Activity  Rest as told by your doctor.  Avoid doing things that cause pain. This includes lifting heavy items.  Ask your doctor what activities are safe for you. General instructions   Take over-the-counter and prescription medicines only as told by your doctor.  Do not use any products that contain nicotine or tobacco, such as cigarettes, e-cigarettes, and chewing tobacco. If you need help quitting, ask your doctor.  Keep all follow-up visits as told by your doctor. This is important. Contact a doctor if:  You have a fever.  Your chest pain gets worse.  You have new symptoms. Get help right away if:  You feel sick to your stomach (nauseous) or you throw up (vomit).  You feel sweaty or light-headed.  You have a cough with mucus from your lungs (sputum) or you cough up blood.  You are short of breath. These symptoms may be an emergency. Do not wait to see if the symptoms will go away. Get medical help right away. Call your local emergency services (911 in the [place] Do not drive yourself to the hospital. Summary  Chest wall pain is pain in or around the bones and muscles of your chest.  It may be treated with ice, rest, and medicines. Your condition may also get better if you avoid doing things that cause pain.  Contact  a doctor if you have a fever, chest pain that gets worse, or new symptoms.  Get help right away if you feel light-headed or you get short of breath. These symptoms may be an emergency. This information is not intended to replace advice given to you by your health care provider. Make sure you discuss any questions you have with your health care provider. Document Released: 09/25/2007 Document Revised: 10/09/2017 Document Reviewed: 10/09/2017 Elsevier Interactive Patient Education  2019 [employer].

## 2018-08-22 ENCOUNTER — Telehealth: Payer: Self-pay | Admitting: Family Medicine

## 2018-08-22 LAB — ANEMIA PROFILE B
Basophils Absolute: 0.1 10*3/uL (ref 0.0–0.2)
Basos: 1 %
EOS (ABSOLUTE): 0.1 10*3/uL (ref 0.0–0.4)
Eos: 1 %
Ferritin: 11 ng/mL — ABNORMAL LOW (ref 15–150)
Folate: 8.3 ng/mL (ref >3.0)
Hematocrit: 33.8 % — ABNORMAL LOW (ref 34.0–46.6)
Hemoglobin: 10.8 g/dL — ABNORMAL LOW (ref 11.1–15.9)
Immature Grans (Abs): 0 10*3/uL (ref 0.0–0.1)
Immature Granulocytes: 0 %
Iron Saturation: 5 % — CL (ref 15–55)
Iron: 20 ug/dL — ABNORMAL LOW (ref 27–139)
Lymphocytes Absolute: 1.7 10*3/uL (ref 0.7–3.1)
Lymphs: 24 %
MCH: 26.5 pg — ABNORMAL LOW (ref 26.6–33.0)
MCHC: 32 g/dL (ref 31.5–35.7)
MCV: 83 fL (ref 79–97)
Monocytes Absolute: 0.6 10*3/uL (ref 0.1–0.9)
Monocytes: 8 %
Neutrophils Absolute: 4.6 10*3/uL (ref 1.4–7.0)
Neutrophils: 66 %
Platelets: 354 10*3/uL (ref 150–450)
RBC: 4.07 x10E6/uL (ref 3.77–5.28)
RDW: 14.6 % (ref 11.7–15.4)
Retic Ct Pct: 1 % (ref 0.6–2.6)
Total Iron Binding Capacity: 414 ug/dL (ref 250–450)
UIBC: 394 ug/dL — ABNORMAL HIGH (ref 118–369)
Vitamin B-12: 127 pg/mL — ABNORMAL LOW (ref 232–1245)
WBC: 7 10*3/uL (ref 3.4–10.8)

## 2018-08-22 NOTE — Telephone Encounter (Signed)
HIPAA compliant callback message left.  If she calls please let her know that her iron and vitamin B12 remains low.  She will benefit from Vit B injection and ferriheme.  In the mean time she can take Ferrous sulfate TID and oral Vitamin B12. I will try to reach her again on Monday if she does not call back.

## 2018-08-24 ENCOUNTER — Telehealth: Payer: Self-pay | Admitting: Family Medicine

## 2018-08-24 NOTE — Telephone Encounter (Signed)
Will forward to MD. Jazmin Hartsell,CMA  

## 2018-08-24 NOTE — Telephone Encounter (Signed)
I have discussed her lab results with her. She was not taking iron pills till last Friday. She takes ferrous sulfate daily. She uses herbal Vit B12 regimen.  I recommended ferriheme infusion and B12 injection but she declined both.  I advised that she takes Ferrous Sulfate TID and not QD. Also take Vit B12 104mcg qd. I offered to escribe her meds but she declined.  She requested that a copy of her result be mailed to her. I will forward message to staff to mail result.   Hello Sherry Wilkinson,   Could you guys mail a copy of patient's most recent lab result to her please? Thanks.

## 2018-08-24 NOTE — Telephone Encounter (Signed)
Pt called returning Dr. Macario Golds phone call from 05-01. Pt requesting Dr. Gwendlyn Deutscher to give her a call back.

## 2018-09-02 ENCOUNTER — Ambulatory Visit
Admission: RE | Admit: 2018-09-02 | Discharge: 2018-09-02 | Disposition: A | Discharge status: Home / Self Care | Payer: Medicare Other | Source: Ambulatory Visit | Attending: Family Medicine | Admitting: Family Medicine

## 2018-09-02 DIAGNOSIS — R079 Chest pain, unspecified: Secondary | ICD-10-CM

## 2018-09-02 MED ORDER — IOPAMIDOL (ISOVUE-300) INJECTION 61%
75.0000 mL | Freq: Once | INTRAVENOUS | Status: AC | PRN
  Administered 2018-09-02: 75 mL via INTRAVENOUS

## 2018-09-03 ENCOUNTER — Telehealth: Payer: Self-pay | Admitting: Family Medicine

## 2018-09-03 NOTE — Telephone Encounter (Signed)
CT chest report discussed. I discussed the breast findings and indication for mammogram. She has her mammogram appointment scheduled for June 23rd. She is aware of her appointment date and time.

## 2018-09-03 NOTE — Telephone Encounter (Signed)
HIPAA compliant call back message left. Need to discuss CT report.

## 2018-10-13 ENCOUNTER — Ambulatory Visit
Admission: RE | Admit: 2018-10-13 | Discharge: 2018-10-13 | Disposition: A | Discharge status: Home / Self Care | Payer: Medicare Other | Source: Ambulatory Visit | Attending: Family Medicine | Admitting: Family Medicine

## 2018-10-13 DIAGNOSIS — Z1231 Encounter for screening mammogram for malignant neoplasm of breast: Secondary | ICD-10-CM | POA: Diagnosis not present

## 2019-03-30 ENCOUNTER — Telehealth: Payer: Self-pay | Admitting: Family Medicine

## 2019-03-30 ENCOUNTER — Ambulatory Visit (INDEPENDENT_AMBULATORY_CARE_PROVIDER_SITE_OTHER): Payer: Medicare Other | Admitting: Family Medicine

## 2019-03-30 ENCOUNTER — Encounter: Payer: Self-pay | Admitting: Family Medicine

## 2019-03-30 ENCOUNTER — Other Ambulatory Visit: Payer: Self-pay

## 2019-03-30 VITALS — BP 118/72 | HR 75 | Wt 139.4 lb

## 2019-03-30 DIAGNOSIS — D649 Anemia, unspecified: Secondary | ICD-10-CM | POA: Diagnosis not present

## 2019-03-30 DIAGNOSIS — R634 Abnormal weight loss: Secondary | ICD-10-CM | POA: Insufficient documentation

## 2019-03-30 LAB — POCT HEMOGLOBIN: Hemoglobin: 10.2 g/dL — AB (ref 11–14.6)

## 2019-03-30 NOTE — Progress Notes (Signed)
     Subjective:     Patient ID: Sherry Wilkinson, female   DOB: August 04, 1945, 73 y.o.   MRN: [id number]  HPI Anemia:She is compliant with Vitamin B12, Oscal D supplement. She takes her Ferrous sulfate daily instead of three times daily. She also takes B complex. Does not take Vitamin E supplement as listed on file for her. Weight management/Appetite:C/O low appetite. She eats one meal daily. No change in her bowel habit, no blood in her stool. No other GI symptoms.  Current Outpatient Medications on File Prior to Visit  Medication Sig Dispense Refill  . B Complex-C (B-COMPLEX WITH VITAMIN C) tablet Take 1 tablet by mouth daily.    . calcium-vitamin D (OSCAL WITH D) 500-200 MG-UNIT tablet Take 1 tablet by mouth 2 (two) times daily. 60 tablet 4  . ferrous sulfate 325 (65 FE) MG EC tablet Take 325 mg by mouth every morning.    . vitamin B-12 (CYANOCOBALAMIN) 500 MCG tablet Take 500 mcg by mouth daily.    . ciclopirox (PENLAC) 8 % solution Apply over nail and surrounding skin. Apply daily over previous coat. After seven (7) days, may remove with alcohol and continue cycle. (Patient not taking: Reported on 04/28/2018) 6.6 mL 5   No current facility-administered medications on file prior to visit.    Past Medical History:  Diagnosis Date  . Appendicitis   . Arthritis    all over - feet and knees especially   . Cataract   . Liver cyst   . Osteopenia   . Perineal abscess 12/16/2014   Vitals:   03/30/19 0951  BP: 118/72  Pulse: 75  SpO2: 100%  Weight: 139 lb 6.4 oz (63.2 kg)     Review of Systems  Respiratory: Negative.   Cardiovascular: Negative.   Gastrointestinal: Negative.   Musculoskeletal: Negative.   Neurological: Negative.   All other systems reviewed and are negative.      Objective:   Physical Exam Vitals signs and nursing note reviewed.  Constitutional:      Appearance: Normal appearance.  Cardiovascular:     Rate and Rhythm: Normal rate and regular rhythm.   Heart sounds: Normal heart sounds. No murmur.  Pulmonary:     Effort: Pulmonary effort is normal. No respiratory distress.     Breath sounds: Normal breath sounds. No stridor. No wheezing.  Abdominal:     General: Abdomen is flat. Bowel sounds are normal. There is no distension.     Palpations: Abdomen is soft. There is no mass.     Tenderness: There is no abdominal tenderness.  Neurological:     Mental Status: She is alert and oriented to person, place, and time.        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 She declined the following vaccinations despite counseling: Flu shot, Pneumovax, Shingrix and Tdap.

## 2019-03-30 NOTE — Assessment & Plan Note (Signed)
Due to reduced calorie. Body mass index is 23.2 kg/m. She was in the mid 150s late 2019 and her last appointment about 6 months ago she was 144, now at 139lbs. Nutrition and diet counseling done. Start MVI and d/c Bcomplex. Hopefully, this will stimulate her appetite. Do not take vitamin E since it does not have a significant benefit. Add milk shake to meal and snack in between meal. Monitor weight closely. She verbalized understanding and agreed with the plan.

## 2019-03-30 NOTE — Telephone Encounter (Signed)
HIPAA compliant callback message left.   Please advise her that her hemoglobin is still low.  Advise to take ferrous sulfate 325 mg TID. F/U in 2 months for full lab work. Call sooner of go to the ED if she feels weak or tired.

## 2019-03-30 NOTE — Telephone Encounter (Signed)
Pt informed and agreeable.  Dariel Pellecchia, CMA  

## 2019-03-30 NOTE — Assessment & Plan Note (Signed)
Encouraged to take Ferrous sulfate TID. POC Hemoglobin checked. I will contact her with the results. Blood work and anemia panel in about 3-6 months. I will let her know based on her POC Hemoglobin report. She agreed with the plan.

## 2019-03-30 NOTE — Patient Instructions (Signed)
It was nice seeing you today. We checked your blood level today for anemia. I will call you with the results and the plan. In the mean time, continue your vitamin b12 daily and iron supplement three times daily. We can recheck your whole blood with anemia panel in 6 months.

## 2019-05-25 ENCOUNTER — Other Ambulatory Visit: Payer: Self-pay

## 2019-05-25 ENCOUNTER — Other Ambulatory Visit: Payer: Self-pay | Admitting: Family Medicine

## 2019-05-25 ENCOUNTER — Other Ambulatory Visit: Payer: Medicare Other

## 2019-05-25 DIAGNOSIS — D649 Anemia, unspecified: Secondary | ICD-10-CM

## 2019-05-25 DIAGNOSIS — D518 Other vitamin B12 deficiency anemias: Secondary | ICD-10-CM | POA: Diagnosis not present

## 2019-05-25 NOTE — Progress Notes (Signed)
pa

## 2019-05-26 ENCOUNTER — Telehealth: Payer: Self-pay | Admitting: Family Medicine

## 2019-05-26 LAB — ANEMIA PROFILE B
Basophils Absolute: 0.1 10*3/uL (ref 0.0–0.2)
Basos: 1 %
EOS (ABSOLUTE): 0.1 10*3/uL (ref 0.0–0.4)
Eos: 1 %
Ferritin: 12 ng/mL — ABNORMAL LOW (ref 15–150)
Folate: >20 ng/mL (ref >3.0)
Hematocrit: 36.3 % (ref 34.0–46.6)
Hemoglobin: 12.1 g/dL (ref 11.1–15.9)
Immature Grans (Abs): 0 10*3/uL (ref 0.0–0.1)
Immature Granulocytes: 0 %
Iron Saturation: 8 % — CL (ref 15–55)
Iron: 29 ug/dL (ref 27–139)
Lymphocytes Absolute: 1.3 10*3/uL (ref 0.7–3.1)
Lymphs: 19 %
MCH: 28.3 pg (ref 26.6–33.0)
MCHC: 33.3 g/dL (ref 31.5–35.7)
MCV: 85 fL (ref 79–97)
Monocytes Absolute: 0.6 10*3/uL (ref 0.1–0.9)
Monocytes: 9 %
Neutrophils Absolute: 5 10*3/uL (ref 1.4–7.0)
Neutrophils: 70 %
Platelets: 322 10*3/uL (ref 150–450)
RBC: 4.28 x10E6/uL (ref 3.77–5.28)
RDW: 14.4 % (ref 11.7–15.4)
Retic Ct Pct: 1 % (ref 0.6–2.6)
Total Iron Binding Capacity: 386 ug/dL (ref 250–450)
UIBC: 357 ug/dL (ref 118–369)
Vitamin B-12: 687 pg/mL (ref 232–1245)
WBC: 7.1 10*3/uL (ref 3.4–10.8)

## 2019-05-26 NOTE — Telephone Encounter (Signed)
HIPAA compliant callback message left.    Note:Iron saturation and ferritin improved although still low. Hemoglobin is back to normal as well as her Vit B12 level.  Continue Ferrous sulphate TID.  Call if having any symptoms.

## 2019-08-31 ENCOUNTER — Ambulatory Visit (INDEPENDENT_AMBULATORY_CARE_PROVIDER_SITE_OTHER): Payer: Medicare Other | Admitting: Family Medicine

## 2019-08-31 ENCOUNTER — Other Ambulatory Visit: Payer: Self-pay

## 2019-08-31 ENCOUNTER — Telehealth: Payer: Self-pay | Admitting: Family Medicine

## 2019-08-31 ENCOUNTER — Encounter: Payer: Self-pay | Admitting: Family Medicine

## 2019-08-31 VITALS — BP 118/64 | HR 78 | Ht 65.0 in | Wt 145.5 lb

## 2019-08-31 DIAGNOSIS — Z1231 Encounter for screening mammogram for malignant neoplasm of breast: Secondary | ICD-10-CM

## 2019-08-31 DIAGNOSIS — L608 Other nail disorders: Secondary | ICD-10-CM

## 2019-08-31 DIAGNOSIS — K7689 Other specified diseases of liver: Secondary | ICD-10-CM

## 2019-08-31 DIAGNOSIS — D519 Vitamin B12 deficiency anemia, unspecified: Secondary | ICD-10-CM | POA: Diagnosis not present

## 2019-08-31 DIAGNOSIS — R7303 Prediabetes: Secondary | ICD-10-CM

## 2019-08-31 DIAGNOSIS — R7309 Other abnormal glucose: Secondary | ICD-10-CM

## 2019-08-31 DIAGNOSIS — D509 Iron deficiency anemia, unspecified: Secondary | ICD-10-CM

## 2019-08-31 HISTORY — DX: Prediabetes: R73.03

## 2019-08-31 LAB — POCT GLYCOSYLATED HEMOGLOBIN (HGB A1C): Hemoglobin A1C: 5.6 % (ref 4.0–5.6)

## 2019-08-31 NOTE — Assessment & Plan Note (Signed)
A1C checked.  I will contact her soon with the result.

## 2019-08-31 NOTE — Telephone Encounter (Signed)
HIPAA compliant callback message left.  Please let her know that her A1C is within normal range.  I will call her with her other test results as a get them.

## 2019-08-31 NOTE — Assessment & Plan Note (Signed)
Resolved

## 2019-08-31 NOTE — Assessment & Plan Note (Signed)
Likely benign. Cmet checked today. I will contact her with the result.

## 2019-08-31 NOTE — Assessment & Plan Note (Signed)
Recheck anemia panel today. Lab ordered. Continue Iron supplement and vitamin b12

## 2019-08-31 NOTE — Progress Notes (Signed)
    SUBJECTIVE:   CHIEF COMPLAINT / HPI: Follow-up  Anemia:She takes ferrous sulfate 325 daily instead of BID. When taken BID she becomes hyper. She endorsed low BP the last time she went to the dentist, although denies any symptoms. She is here for follow-up. Liver cyst: Denies GI symptoms. Here for f/u. Toenail discoloration: Left big toenail discoloration grew out and is normal now. Endorses tingling sensation of her left big toe. PreDM: She is here for f/u. HM: Need to update colon cancer. Her gastroenterologist called her in 2020 and was informed, that they will contact her soon for an appoint. They never did. She is due for vaccinations PNA and Tdap  PERTINENT  PMH / PSH: PMX reviewed  OBJECTIVE:   Vitals:   08/31/19 0851  BP: 118/64  Pulse: 78  SpO2: 98%  Weight: 145 lb 8 oz (66 kg)  Height: 5\' 5"  (1.651 m)     Physical Exam Vitals and nursing note reviewed.  Cardiovascular:     Rate and Rhythm: Normal rate and regular rhythm.     Heart sounds: Normal heart sounds. No murmur.  Pulmonary:     Effort: Pulmonary effort is normal. No respiratory distress.     Breath sounds: Normal breath sounds. No wheezing.  Abdominal:     General: Abdomen is flat. There is no distension.     Palpations: Abdomen is soft. There is no mass.     Tenderness: There is no abdominal tenderness.  Musculoskeletal:     Right lower leg: No edema.     Left lower leg: No edema.     Comments: Toenails are well trimmed without discoloration.  Neurological:     Mental Status: She is alert.     Sensory: Sensation is intact.     Comments: No sensory loss of her feet or toes      ASSESSMENT/PLAN:   Anemia Recheck anemia panel today. Lab ordered. Continue Iron supplement and vitamin b12  Liver cyst Likely benign. Cmet checked today. I will contact her with the result.  Nail discoloration Resolved  Prediabetes A1C checked.  I will contact her soon with the result.  NB: A1C 5.6     HM: Mammogram slip given, she is due in June. Mammogram also ordered. She will contact her gastroenterologist to schedule colonoscopy. She declined COVID-19, PNA and Tdap shot.  Andrena Mews, MD [place]   Patient gave me her cel lphone number for MyChart utilization: [phone]

## 2019-08-31 NOTE — Patient Instructions (Signed)
Colonoscopy, Adult A colonoscopy is an exam to look at the large intestine. It is done using a long, thin, flexible tube that has a camera on the end. This exam is done to check for problems, such as:  Lumps (tumors).  Growths (polyps).  Irritation and swelling (inflammation).  Bleeding. Tell your doctor about:  Any allergies you have.  All medicines you are taking. Tell him or her about vitamins, herbs, eye drops, creams, and over-the-counter medicines.  Any problems you or family members have had with anesthetic medicines.  Any blood disorders you have.  Any surgeries you have had.  Any medical conditions you have.  Whether you are pregnant or may be pregnant.  Any problems you have had with pooping (having bowel movements). What are the risks? Generally, this is a safe procedure. However, problems may occur, such as:  Bleeding.  Damage to your intestine.  Allergic reactions to medicines given during the procedure.  Infection. This is rare. What happens before the procedure? Eating and drinking Follow instructions from your doctor about eating and drinking. These may include:  A few days before the procedure: ? Follow a low-fiber diet. ? Avoid these foods:  Nuts.  Seeds.  Dried fruit.  Raw fruits.  Vegetables.  1-3 days before the procedure: ? Eat only gelatin dessert or ice pops. ? Drink only clear liquids, such as:  Water.  Clear broth or bouillon.  Black coffee or tea.  Clear juice.  Clear soft drinks or sports drinks. ? Avoid liquids that have red or purple dye.  On the day of the procedure: ? Do not eat solid foods. You may continue to drink clear liquids until up to 2 hours before the procedure. ? Do not eat or drink anything starting 2 hours before the procedure or as told by your doctor. Bowel prep If you were prescribed a bowel prep to take by mouth (orally) to clean out your colon:  Take it as told by your doctor. Starting the  day before your procedure, you will need to drink a lot of liquid medicine. The liquid will cause you to poop until your poop is almost clear or light green.  If your skin or butt gets irritated from diarrhea, you may: ? Wipe the area with wipes that have medicine in them, such as adult wet wipes with aloe and vitamin E. ? Put something on your skin that soothes the area, such as petroleum jelly.  If you vomit while drinking the bowel prep: ? Take a break for up to 60 minutes. ? Begin the bowel prep again. ? Call your doctor if you keep vomiting and you cannot take the bowel prep without vomiting.  To clean out your colon, you may also be given: ? Laxative medicines. These help you poop. ? Instructions for using a liquid medicine (enema) injected into your butt. Medicines Ask your doctor about:  Changing or stopping your normal medicines. This is important.  Taking aspirin and ibuprofen. Do not take these medicines unless your doctor tells you to take them.  Taking over-the-counter medicines, vitamins, herbs, and supplements. General instructions  Ask your doctor what steps will be taken to help prevent the spread of germs. These may include washing skin with a germ-killing soap.  Plan to have someone take you home from the hospital or clinic. What happens during the procedure?   An IV tube may be put into one of your veins.  You may be given one or more of  the following: ? A medicine to help you relax (sedative). ? A medicine to numb the area (local anesthetic). ? A medicine to make you fall asleep (general anesthetic). This is rarely needed.  You will lie on your side with your knees bent.  The tube will: ? Have oil or gel put on it. ? Be put into the opening of the butt (anus). ? Be gently put into your large intestine.  Air will be sent into your colon to keep it open. You may feel some pressure or cramping.  The camera will be used to take photos that will appear on  a screen.  A small tissue sample may be removed for testing (biopsy).  If small growths are found, your doctor may remove them and have them checked for cancer.  The tube will be slowly removed. The procedure may vary among doctors and hospitals. What happens after the procedure?  You will be monitored until you leave the hospital or clinic. This includes checking your: ? Blood pressure. ? Heart rate. ? Breathing rate. ? Blood oxygen level.  You may have a small amount of blood in your poop.  You may pass gas.  You may have mild cramps or bloating in your belly (abdomen).  Do not drive for 24 hours after the procedure.  It is up to you to get the results of your procedure. Ask your doctor, or the department that is doing the procedure, when your results will be ready. Summary  A colonoscopy is an exam to look at the large intestine.  Follow instructions from your doctor about eating and drinking before the procedure.  You may be prescribed an oral bowel prep to clean out your colon. Take it as told by your doctor.  A flexible tube with a camera on its end will be put into the opening of the butt. It will be passed into the large intestine.  You will be monitored until you leave the hospital or clinic. This information is not intended to replace advice given to you by your health care provider. Make sure you discuss any questions you have with your health care provider. Document Revised: 10/30/2018 Document Reviewed: 10/30/2018 Elsevier Patient Education  2020 [employer]  

## 2019-09-01 ENCOUNTER — Telehealth: Payer: Self-pay | Admitting: Family Medicine

## 2019-09-01 LAB — ANEMIA PROFILE B
Basophils Absolute: 0.1 10*3/uL (ref 0.0–0.2)
Basos: 1 %
EOS (ABSOLUTE): 0.1 10*3/uL (ref 0.0–0.4)
Eos: 2 %
Ferritin: 12 ng/mL — ABNORMAL LOW (ref 15–150)
Folate: >20 ng/mL (ref >3.0)
Hematocrit: 37.6 % (ref 34.0–46.6)
Hemoglobin: 11.7 g/dL (ref 11.1–15.9)
Immature Grans (Abs): 0 10*3/uL (ref 0.0–0.1)
Immature Granulocytes: 0 %
Iron Saturation: 12 % — ABNORMAL LOW (ref 15–55)
Iron: 43 ug/dL (ref 27–139)
Lymphocytes Absolute: 1.4 10*3/uL (ref 0.7–3.1)
Lymphs: 22 %
MCH: 26.7 pg (ref 26.6–33.0)
MCHC: 31.1 g/dL — ABNORMAL LOW (ref 31.5–35.7)
MCV: 86 fL (ref 79–97)
Monocytes Absolute: 0.6 10*3/uL (ref 0.1–0.9)
Monocytes: 10 %
Neutrophils Absolute: 4.3 10*3/uL (ref 1.4–7.0)
Neutrophils: 65 %
Platelets: 332 10*3/uL (ref 150–450)
RBC: 4.38 x10E6/uL (ref 3.77–5.28)
RDW: 14.6 % (ref 11.7–15.4)
Retic Ct Pct: 1.1 % (ref 0.6–2.6)
Total Iron Binding Capacity: 365 ug/dL (ref 250–450)
UIBC: 322 ug/dL (ref 118–369)
Vitamin B-12: 551 pg/mL (ref 232–1245)
WBC: 6.5 10*3/uL (ref 3.4–10.8)

## 2019-09-01 LAB — CMP14+EGFR
ALT: 15 IU/L (ref 0–32)
AST: 25 IU/L (ref 0–40)
Albumin/Globulin Ratio: 1.7 (ref 1.2–2.2)
Albumin: 4.5 g/dL (ref 3.7–4.7)
Alkaline Phosphatase: 83 IU/L (ref 39–117)
BUN/Creatinine Ratio: 8 — ABNORMAL LOW (ref 12–28)
BUN: 8 mg/dL (ref 8–27)
Bilirubin Total: 0.4 mg/dL (ref 0.0–1.2)
CO2: 23 mmol/L (ref 20–29)
Calcium: 9.7 mg/dL (ref 8.7–10.3)
Chloride: 105 mmol/L (ref 96–106)
Creatinine, Ser: 0.98 mg/dL (ref 0.57–1.00)
GFR calc Af Amer: 66 mL/min/{1.73_m2} (ref >59)
GFR calc non Af Amer: 57 mL/min/{1.73_m2} — ABNORMAL LOW (ref >59)
Globulin, Total: 2.6 g/dL (ref 1.5–4.5)
Glucose: 79 mg/dL (ref 65–99)
Potassium: 4.2 mmol/L (ref 3.5–5.2)
Sodium: 144 mmol/L (ref 134–144)
Total Protein: 7.1 g/dL (ref 6.0–8.5)

## 2019-09-01 NOTE — Telephone Encounter (Signed)
I discussed her test results with her.  Iron level improved a bit. Ferritin remains at 12.  She will try to take iron BID instead of TID. Discussed iron infusion which she is not interested in.  Her hemoglobin level is however within range.  I reminded her to call GI for her colonoscopy. She denies blood in her stool or any form of bleeding which is reassuring.  F/U in 6 months for reassessment, or sooner, if feeling weak, or dizzy.  She verbalized understanding.

## 2019-10-14 ENCOUNTER — Ambulatory Visit
Admission: RE | Admit: 2019-10-14 | Discharge: 2019-10-14 | Disposition: A | Discharge status: Home / Self Care | Payer: Medicare Other | Source: Ambulatory Visit | Attending: Family Medicine | Admitting: Family Medicine

## 2019-10-14 ENCOUNTER — Other Ambulatory Visit: Payer: Self-pay

## 2019-10-14 DIAGNOSIS — Z1231 Encounter for screening mammogram for malignant neoplasm of breast: Secondary | ICD-10-CM

## 2019-10-18 ENCOUNTER — Telehealth: Payer: Self-pay | Admitting: Family Medicine

## 2019-10-18 NOTE — Telephone Encounter (Signed)
HIPAA compliant callback message left.   Whenever she call, please inform her that her mammogram test is negative for cancer. Repeat in 1 year.

## 2019-12-24 ENCOUNTER — Encounter: Payer: Self-pay | Admitting: Family Medicine

## 2019-12-24 ENCOUNTER — Other Ambulatory Visit: Payer: Self-pay

## 2019-12-24 ENCOUNTER — Ambulatory Visit (INDEPENDENT_AMBULATORY_CARE_PROVIDER_SITE_OTHER): Payer: Medicare Other | Admitting: Family Medicine

## 2019-12-24 VITALS — BP 120/72 | HR 72 | Wt 144.0 lb

## 2019-12-24 DIAGNOSIS — Z0000 Encounter for general adult medical examination without abnormal findings: Secondary | ICD-10-CM | POA: Insufficient documentation

## 2019-12-24 DIAGNOSIS — R8290 Unspecified abnormal findings in urine: Secondary | ICD-10-CM | POA: Insufficient documentation

## 2019-12-24 LAB — POCT URINALYSIS DIP (MANUAL ENTRY)
Bilirubin, UA: NEGATIVE
Blood, UA: NEGATIVE
Glucose, UA: NEGATIVE mg/dL
Ketones, POC UA: NEGATIVE mg/dL
Leukocytes, UA: NEGATIVE
Nitrite, UA: NEGATIVE
Protein Ur, POC: NEGATIVE mg/dL
Spec Grav, UA: 1.02 (ref 1.010–1.025)
Urobilinogen, UA: 0.2 E.U./dL
pH, UA: 6.5 (ref 5.0–8.0)

## 2019-12-24 NOTE — Assessment & Plan Note (Signed)
Continuous for 3 months. Urine is mildly cloudy. Patient is asymptomatic and Urinalysis is unremarkable. Medications non-contributory.  -Reassurance -Consider making dietary changes -Follow up if symptoms change/worsen

## 2019-12-24 NOTE — Assessment & Plan Note (Signed)
-  COVID 19 vaccine: declined -Flu vaccine: declined -Tdap: declined -Pneumococcal vaccine: declined

## 2019-12-24 NOTE — Patient Instructions (Signed)
It was very nice to meet you today. Please enjoy the rest of your week.   Today you were seen for cloudiness and odor in urine. You urine test was normal.   Consider removing foods or drinks from your diet gradual to see if this improves your symptoms.  Please call the clinic at [phone] if your symptoms worsen or you have any concerns. It was our pleasure to serve you.  Dr. Janus Molder

## 2019-12-24 NOTE — Progress Notes (Signed)
   SUBJECTIVE:   CHIEF COMPLAINT / HPI:   [name] is a 74 yo F who presents for the issue below.   Cloudy urine  12 weeks of noticing cloudy and a bad odor. No history of recurrent UTIs. This has never happened before. Denies dysuria, urethral/vaginal discharge, blood-tinged urine. No changes in medications.  PERTINENT  PMH / PSH: Hx of Prediabetes (last A1c 08/31/2019 5.6)  OBJECTIVE:   BP 120/72   Pulse 72   Wt 144 lb (65.3 kg)   SpO2 98%   BMI 23.96 kg/m   General: Appears well, no acute distress. Age appropriate.  Results for CAMBRY, SPAMPINATO (MRN [id number]) as of 12/24/2019 15:49  Ref. Range 12/24/2019 15:11 12/24/2019 15:11  Bilirubin, UA Latest Ref Range: negative mg/dL negative negative  Clarity, UA Latest Ref Range: clear  cloudy (A)   Color, UA Latest Ref Range: yellow  yellow   Glucose Latest Ref Range: negative mg/dL negative   Leukocytes,UA Latest Ref Range: Negative  Negative   Nitrite, UA Latest Ref Range: Negative  Negative   pH, UA Latest Ref Range: 5.0 - 8.0  6.5   Protein,UA Latest Ref Range: negative mg/dL negative   Specific Gravity, UA Latest Ref Range: 1.010 - 1.025  1.020   Urobilinogen, UA Latest Ref Range: 0.2 or 1.0 E.U./dL 0.2     ASSESSMENT/PLAN:   Cloudy urine Continuous for 3 months. Urine is mildly cloudy. Patient is asymptomatic and Urinalysis is unremarkable. Medications non-contributory.  -Reassurance -Consider making dietary changes -Follow up if symptoms change/worsen  Healthcare maintenance -COVID 19 vaccine: declined -Flu vaccine: declined -Tdap: declined -Pneumococcal vaccine: declined  Gerlene Fee, [place]  *Discussed patient with Dr. Nori Riis

## 2020-03-23 ENCOUNTER — Encounter: Payer: Self-pay | Admitting: Family Medicine

## 2020-03-24 ENCOUNTER — Encounter: Payer: Self-pay | Admitting: Family Medicine

## 2020-03-24 ENCOUNTER — Other Ambulatory Visit: Payer: Self-pay

## 2020-03-24 ENCOUNTER — Ambulatory Visit (INDEPENDENT_AMBULATORY_CARE_PROVIDER_SITE_OTHER): Payer: Medicare Other | Admitting: Family Medicine

## 2020-03-24 VITALS — BP 119/72 | HR 72 | Ht 65.0 in | Wt 146.4 lb

## 2020-03-24 DIAGNOSIS — E1169 Type 2 diabetes mellitus with other specified complication: Secondary | ICD-10-CM | POA: Diagnosis not present

## 2020-03-24 DIAGNOSIS — D510 Vitamin B12 deficiency anemia due to intrinsic factor deficiency: Secondary | ICD-10-CM | POA: Diagnosis not present

## 2020-03-24 DIAGNOSIS — E785 Hyperlipidemia, unspecified: Secondary | ICD-10-CM

## 2020-03-24 DIAGNOSIS — E538 Deficiency of other specified B group vitamins: Secondary | ICD-10-CM | POA: Diagnosis not present

## 2020-03-24 DIAGNOSIS — Z79899 Other long term (current) drug therapy: Secondary | ICD-10-CM | POA: Diagnosis not present

## 2020-03-24 DIAGNOSIS — Z1211 Encounter for screening for malignant neoplasm of colon: Secondary | ICD-10-CM

## 2020-03-24 DIAGNOSIS — D509 Iron deficiency anemia, unspecified: Secondary | ICD-10-CM | POA: Diagnosis not present

## 2020-03-24 NOTE — Patient Instructions (Signed)
Colonoscopy, Adult, Care After This sheet gives you information about how to care for yourself after your procedure. Your doctor may also give you more specific instructions. If you have problems or questions, call your doctor. What can I expect after the procedure? After the procedure, it is common to have:  A small amount of blood in your poop (stool) for 24 hours.  Some gas.  Mild cramping or bloating in your belly (abdomen). Follow these instructions at home: Eating and drinking   Drink enough fluid to keep your pee (urine) pale yellow.  Follow instructions from your doctor about what you cannot eat or drink.  Return to your normal diet as told by your doctor. Avoid heavy or fried foods that are hard to digest. Activity  Rest as told by your doctor.  Do not sit for a long time without moving. Get up to take short walks every 1-2 hours. This is important. Ask for help if you feel weak or unsteady.  Return to your normal activities as told by your doctor. Ask your doctor what activities are safe for you. To help cramping and bloating:   Try walking around.  Put heat on your belly as told by your doctor. Use the heat source that your doctor recommends, such as a moist heat pack or a heating pad. ? Put a towel between your skin and the heat source. ? Leave the heat on for 20-30 minutes. ? Remove the heat if your skin turns bright red. This is very important if you are unable to feel pain, heat, or cold. You may have a greater risk of getting burned. General instructions  For the first 24 hours after the procedure: ? Do not drive or use machinery. ? Do not sign important documents. ? Do not drink alcohol. ? Do your daily activities more slowly than normal. ? Eat foods that are soft and easy to digest.  Take over-the-counter or prescription medicines only as told by your doctor.  Keep all follow-up visits as told by your doctor. This is important. Contact a doctor  if:  You have blood in your poop 2-3 days after the procedure. Get help right away if:  You have more than a small amount of blood in your poop.  You see large clumps of tissue (blood clots) in your poop.  Your belly is swollen.  You feel like you may vomit (nauseous).  You vomit.  You have a fever.  You have belly pain that gets worse, and medicine does not help your pain. Summary  After the procedure, it is common to have a small amount of blood in your poop. You may also have mild cramping and bloating in your belly.  For the first 24 hours after the procedure, do not drive or use machinery, do not sign important documents, and do not drink alcohol.  Get help right away if you have a lot of blood in your poop, feel like you may vomit, have a fever, or have more belly pain. This information is not intended to replace advice given to you by your health care provider. Make sure you discuss any questions you have with your health care provider. Document Revised: 11/02/2018 Document Reviewed: 11/02/2018 Elsevier Patient Education  2020 [employer]vier Inc.  

## 2020-03-24 NOTE — Assessment & Plan Note (Signed)
No acute symptoms. Anemia panel checked. Continue iron supplement one tab daily for now.

## 2020-03-24 NOTE — Progress Notes (Signed)
    SUBJECTIVE:   CHIEF COMPLAINT / HPI:   Iron deficiency and vitamin B12 anemia: She is here for follow-up. She is compliant with daily iron and vitamin B12 supplements. She denies GI bleed. Feels well in general.  HLD: Hx of hypertriglyceridemia: On diet control. Here for follow-up.  HM:She does not want any vaccination. Yet to schedule colon cancer screening.  PERTINENT  PMH / PSH: PMX reviewed.  OBJECTIVE:   BP 119/72   Pulse 72   Ht 5\' 5"  (1.651 m)   Wt 146 lb 6.4 oz (66.4 kg)   SpO2 98%   BMI 24.36 kg/m   Physical Exam Vitals and nursing note reviewed.  Eyes:     Comments: No conjunctival pallor  Cardiovascular:     Rate and Rhythm: Normal rate and regular rhythm.     Heart sounds: Normal heart sounds. No murmur heard.   Pulmonary:     Effort: Pulmonary effort is normal.     Breath sounds: Normal breath sounds. No wheezing or rhonchi.  Abdominal:     General: Abdomen is flat. Bowel sounds are normal. There is no distension.     Palpations: Abdomen is soft. There is no mass.     Tenderness: There is no abdominal tenderness.  Musculoskeletal:     Right lower leg: No edema.     Left lower leg: No edema.      ASSESSMENT/PLAN:   Vitamin B 12 deficiency Asymptomatic. Recent lab looks good. Vitamin B12 checked today. If normal, may check yearly instead. Continue B12 supplement.  Anemia No acute symptoms. Anemia panel checked. Continue iron supplement one tab daily for now.   HLD: Previously elevated triglyceride- improved to normal. FLP checked today. I will contact her with the result. She is otherwise, doing well.  Health maintenance: Discussed colonoscopy. She is ok with GI referral. She declined both influenza and COVID-19 shot. She declined pneumovax.  Andrena Mews, MD [place]

## 2020-03-24 NOTE — Assessment & Plan Note (Signed)
Asymptomatic. Recent lab looks good. Vitamin B12 checked today. If normal, may check yearly instead. Continue B12 supplement.

## 2020-03-25 LAB — ANEMIA PROFILE B
Basophils Absolute: 0.1 10*3/uL (ref 0.0–0.2)
Basos: 1 %
EOS (ABSOLUTE): 0.1 10*3/uL (ref 0.0–0.4)
Eos: 2 %
Ferritin: 14 ng/mL — ABNORMAL LOW (ref 15–150)
Folate: >20 ng/mL (ref >3.0)
Hematocrit: 38.5 % (ref 34.0–46.6)
Hemoglobin: 13 g/dL (ref 11.1–15.9)
Immature Grans (Abs): 0 10*3/uL (ref 0.0–0.1)
Immature Granulocytes: 0 %
Iron Saturation: 23 % (ref 15–55)
Iron: 80 ug/dL (ref 27–139)
Lymphocytes Absolute: 2 10*3/uL (ref 0.7–3.1)
Lymphs: 30 %
MCH: 29.7 pg (ref 26.6–33.0)
MCHC: 33.8 g/dL (ref 31.5–35.7)
MCV: 88 fL (ref 79–97)
Monocytes Absolute: 0.7 10*3/uL (ref 0.1–0.9)
Monocytes: 11 %
Neutrophils Absolute: 3.7 10*3/uL (ref 1.4–7.0)
Neutrophils: 56 %
Platelets: 318 10*3/uL (ref 150–450)
RBC: 4.37 x10E6/uL (ref 3.77–5.28)
RDW: 14.3 % (ref 11.7–15.4)
Retic Ct Pct: 1 % (ref 0.6–2.6)
Total Iron Binding Capacity: 355 ug/dL (ref 250–450)
UIBC: 275 ug/dL (ref 118–369)
Vitamin B-12: 670 pg/mL (ref 232–1245)
WBC: 6.6 10*3/uL (ref 3.4–10.8)

## 2020-03-25 LAB — LIPID PANEL
Chol/HDL Ratio: 3.3 ratio (ref 0.0–4.4)
Cholesterol, Total: 213 mg/dL — ABNORMAL HIGH (ref 100–199)
HDL: 64 mg/dL (ref >39)
LDL Chol Calc (NIH): 127 mg/dL — ABNORMAL HIGH (ref 0–99)
Triglycerides: 124 mg/dL (ref 0–149)
VLDL Cholesterol Cal: 22 mg/dL (ref 5–40)

## 2020-03-27 ENCOUNTER — Telehealth: Payer: Self-pay | Admitting: Family Medicine

## 2020-03-27 NOTE — Telephone Encounter (Signed)
Result discussed.  LDL and total Chol elevated. 10-yr ASCVD risk of 12%.  Moderate intensity Statin discussed. She declined. She will work on lifestyle modification. Repeat lab in 6-12 months.

## 2020-04-27 ENCOUNTER — Emergency Department (HOSPITAL_COMMUNITY): Payer: Medicare Other

## 2020-04-27 ENCOUNTER — Other Ambulatory Visit: Payer: Self-pay

## 2020-04-27 ENCOUNTER — Emergency Department (HOSPITAL_COMMUNITY)
Admission: EM | Admit: 2020-04-27 | Discharge: 2020-04-27 | Disposition: A | Discharge status: Home / Self Care | Payer: Medicare Other | Attending: Emergency Medicine | Admitting: Emergency Medicine

## 2020-04-27 ENCOUNTER — Encounter (HOSPITAL_COMMUNITY): Payer: Self-pay | Admitting: Emergency Medicine

## 2020-04-27 DIAGNOSIS — M79671 Pain in right foot: Secondary | ICD-10-CM | POA: Diagnosis not present

## 2020-04-27 DIAGNOSIS — Z87891 Personal history of nicotine dependence: Secondary | ICD-10-CM | POA: Diagnosis not present

## 2020-04-27 DIAGNOSIS — M7989 Other specified soft tissue disorders: Secondary | ICD-10-CM | POA: Diagnosis not present

## 2020-04-27 MED ORDER — DICLOFENAC SODIUM 1 % EX GEL: 2.0000 g | Freq: Four times a day (QID) | CUTANEOUS | 0 refills | Status: DC | PRN

## 2020-04-27 NOTE — ED Triage Notes (Signed)
Pt arrives to ED with x8 days of right foot pain/aching. Does not remember an event that could of caused pain. States its constant and has not gotten worse and/or better.

## 2020-04-27 NOTE — ED Notes (Signed)
Discharge  Paperwork given to pt. Pt understands  Instructions and is agreeable to d/c. Esignature pad not available.

## 2020-04-27 NOTE — ED Provider Notes (Signed)
Emergency Department Provider Note   I have reviewed the triage vital signs and the nursing notes.   HISTORY  Chief Complaint Foot Pain   HPI Sherry Wilkinson is a 75 y.o. female with past medical history reviewed below presents to the emergency department for evaluation of right foot pain.  She is describing some burning pain over the medial aspect of her ankle radiating over the top of the foot.  No burning pain, pins-and-needles, or numbness to the bottom of the foot. No pain radiating up the calf or leg swelling. No knee pain. No back pain.     Past Medical History:  Diagnosis Date  . Appendicitis   . Arthritis    all over - feet and knees especially   . Cataract   . Liver cyst   . Osteopenia   . Paresthesia 10/10/2017  . Perineal abscess 12/16/2014    Patient Active Problem List   Diagnosis Date Noted  . Prediabetes 08/31/2019  . Weight loss 03/30/2019  . Anemia 08/21/2018  . Vitamin B 12 deficiency 10/28/2017  . Nail discoloration 10/10/2017  . Hemorrhoid 01/24/2017  . Osteoporosis 08/13/2016  . Liver cyst 08/04/2012    Past Surgical History:  Procedure Laterality Date  . APPENDECTOMY  1970  . COLONOSCOPY    . POLYPECTOMY    . UTERINE FIBROID SURGERY    . WRIST SURGERY      Allergies Patient has no known allergies.  Family History  Problem Relation Age of Onset  . Heart disease Mother   . Heart disease Maternal Grandmother   . Colon cancer Neg Hx   . Esophageal cancer Neg Hx   . Rectal cancer Neg Hx   . Stomach cancer Neg Hx   . Colon polyps Neg Hx     Social History Social History   Tobacco Use  . Smoking status: Former Smoker    Types: Cigarettes    Quit date: 08/21/1998    Years since quitting: 21.7  . Smokeless tobacco: Never Used  Substance Use Topics  . Alcohol use: No  . Drug use: No    Review of Systems  Constitutional: No fever/chills Musculoskeletal: Negative for back pain. Right foot/ankle pain.  Skin: Negative for  rash. Neurological: Negative for numbness.  ____________________________________________   PHYSICAL EXAM:  VITAL SIGNS: ED Triage Vitals  Enc Vitals Group     BP 04/27/20 1246 (!) 160/100     Pulse Rate 04/27/20 1246 72     Resp 04/27/20 1246 18     Temp 04/27/20 1246 98.5 F (36.9 C)     Temp Source 04/27/20 1246 Oral     SpO2 04/27/20 1246 100 %   Constitutional: Alert and oriented. Well appearing and in no acute distress. Eyes: Conjunctivae are normal. Head: Atraumatic. Nose: No congestion/rhinnorhea. Mouth/Throat: Mucous membranes are moist.  Neck: No stridor.   Cardiovascular: Good peripheral circulation. Normal DP/PT pulses.  Respiratory: Normal respiratory effort.   Gastrointestinal: No distention.  Musculoskeletal: Normal range of motion of the right ankle.  No joint swelling or redness.  No tenderness to palpation over the metatarsals or medial malleolus.  Neurologic:  Normal speech and language. Normal sensation over the foot.  Skin:  Skin is warm, dry and intact. No rash noted.  ____________________________________________  RADIOLOGY  DG Foot Complete Right  Result Date: 04/27/2020 CLINICAL DATA:  Foot pain and swelling for several days, no known injury, initial encounter EXAM: RIGHT FOOT COMPLETE - 3+ VIEW COMPARISON:  None. FINDINGS:  There is no evidence of fracture or dislocation. There is no evidence of arthropathy or other focal bone abnormality. Soft tissues are unremarkable. IMPRESSION: No acute abnormality noted. Electronically Signed   By: Inez Catalina M.D.   On: 04/27/2020 13:13    ____________________________________________   PROCEDURES  Procedure(s) performed:   Procedures  None  ____________________________________________   INITIAL IMPRESSION / ASSESSMENT AND PLAN / ED COURSE  Pertinent labs & imaging results that were available during my care of the patient were reviewed by me and considered in my medical decision making (see chart for  details).   Patient presents to the emergency department for evaluation of right foot pain which is burning in nature.  Possible neuropathy but distribution is somewhat atypical.  No numbness to the foot.  No concern clinically for septic joint.  X-ray of the foot performed in triage shows no acute fractures or other abnormality.  Patient is ambulatory on the foot with symptoms relieved with Ace wrap.  Plan for Voltaren gel provided contact information for podiatry to call and follow-up as an outpatient.    ____________________________________________  FINAL CLINICAL IMPRESSION(S) / ED DIAGNOSES  Final diagnoses:  Right foot pain     NEW OUTPATIENT MEDICATIONS STARTED DURING THIS VISIT:  Discharge Medication List as of 04/27/2020  3:35 PM    START taking these medications   Details  diclofenac Sodium (VOLTAREN) 1 % GEL Apply 2 g topically 4 (four) times daily as needed., Starting Thu 04/27/2020, Normal        Note:  This document was prepared using Dragon voice recognition software and may include unintentional dictation errors.  Nanda Quinton, MD, [facility]    [place]ano Rosevear, Wonda Olds, MD 04/28/20 [phone]

## 2020-04-27 NOTE — Discharge Instructions (Signed)
You were seen in the emerge department today with foot pain.  I have called and a pain relieving gel to your pharmacy which you can pick up on the way home.  Please call the podiatry office this afternoon to schedule a follow-up appointment.  Return to the emergency department any new or suddenly worsening symptoms.

## 2020-06-06 ENCOUNTER — Encounter: Payer: Self-pay | Admitting: Internal Medicine

## 2020-06-12 ENCOUNTER — Encounter: Payer: Self-pay | Admitting: Internal Medicine

## 2020-07-06 ENCOUNTER — Ambulatory Visit (AMBULATORY_SURGERY_CENTER): Payer: Self-pay | Admitting: *Deleted

## 2020-07-06 ENCOUNTER — Other Ambulatory Visit: Payer: Self-pay

## 2020-07-06 VITALS — Ht 64.0 in | Wt 141.0 lb

## 2020-07-06 DIAGNOSIS — Z86010 Personal history of colonic polyps: Secondary | ICD-10-CM

## 2020-07-06 MED ORDER — PEG 3350-KCL-NA BICARB-NACL 420 G PO SOLR: 4000.0000 mL | Freq: Once | ORAL | 0 refills | Status: AC

## 2020-07-06 NOTE — Progress Notes (Signed)
No egg or soy allergy known to patient  No issues with past sedation with any surgeries or procedures Patient denies ever being told they had issues or difficulty with intubation  No FH of Malignant Hyperthermia No diet pills per patient No home 02 use per patient  No blood thinners per patient  Pt denies issues with constipation  No A fib or A flutter  EMMI video to pt or via [place] 19 guidelines implemented in [place] today with Pt and RN   Pt plans to hire a care partner service to accompany her to her procedure.  She did this last time and plans to use the same service.      NO PA's for preps discussed with pt In PV today  Discussed with pt there will be an out-of-pocket cost for prep and that varies from $0 to 70 dollars   Due to the COVID-19 pandemic we are asking patients to follow certain guidelines.  Pt aware of COVID protocols and [facility] guidelines

## 2020-07-25 ENCOUNTER — Ambulatory Visit (AMBULATORY_SURGERY_CENTER): Payer: Medicare Other | Admitting: Internal Medicine

## 2020-07-25 ENCOUNTER — Other Ambulatory Visit: Payer: Self-pay

## 2020-07-25 ENCOUNTER — Encounter: Payer: Self-pay | Admitting: Internal Medicine

## 2020-07-25 VITALS — BP 121/58 | HR 48 | Temp 97.8°F | Resp 17 | Ht 65.0 in | Wt 141.0 lb

## 2020-07-25 DIAGNOSIS — Z86010 Personal history of colonic polyps: Secondary | ICD-10-CM | POA: Diagnosis not present

## 2020-07-25 DIAGNOSIS — D123 Benign neoplasm of transverse colon: Secondary | ICD-10-CM

## 2020-07-25 HISTORY — PX: COLONOSCOPY WITH PROPOFOL: SHX5780

## 2020-07-25 MED ORDER — SODIUM CHLORIDE 0.9 % IV SOLN: 500.0000 mL | Freq: Once | INTRAVENOUS | Status: DC

## 2020-07-25 NOTE — Progress Notes (Signed)
Called to room to assist during endoscopic procedure.  Patient ID and intended procedure confirmed with present staff. Received instructions for my participation in the procedure from the performing physician.  

## 2020-07-25 NOTE — Op Note (Signed)
[facility] Patient Name: Sherry Wilkinson Procedure Date: 07/25/2020 10:48 AM MRN: [id number] Endoscopist: Jerene Bears , MD Age: 75 Referring MD:  Date of Birth: [birth year] Gender: Female Account #: [id number] Procedure:                Colonoscopy Indications:              High risk colon cancer surveillance: Personal                            history of multiple (3 or more) adenomas, Last                            colonoscopy: November 2018 Medicines:                Monitored Anesthesia Care Procedure:                Pre-Anesthesia Assessment:                           - Prior to the procedure, a History and Physical                            was performed, and patient medications and                            allergies were reviewed. The patient's tolerance of                            previous anesthesia was also reviewed. The risks                            and benefits of the procedure and the sedation                            options and risks were discussed with the patient.                            All questions were answered, and informed consent                            was obtained. Prior Anticoagulants: The patient has                            taken no previous anticoagulant or antiplatelet                            agents. ASA Grade Assessment: II - A patient with                            mild systemic disease. After reviewing the risks                            and benefits, the patient was deemed in  satisfactory condition to undergo the procedure.                           After obtaining informed consent, the colonoscope                            was passed under direct vision. Throughout the                            procedure, the patient's blood pressure, pulse, and                            oxygen saturations were monitored continuously. The                            Olympus PFC-[id number])  Colonoscope was                            introduced through the anus and advanced to the                            cecum, identified by appendiceal orifice and                            ileocecal valve. The colonoscopy was performed                            without difficulty. The patient tolerated the                            procedure well. The quality of the bowel                            preparation was poor clearing to fair with copious                            irrigation and lavage. The ileocecal valve,                            appendiceal orifice, and rectum were photographed. Scope In: 11:01:09 AM Scope Out: 11:21:20 AM Scope Withdrawal Time: 0 hours 16 minutes 31 seconds  Total Procedure Duration: 0 hours 20 minutes 11 seconds  Findings:                 The digital rectal exam was normal.                           Two sessile polyps were found in the transverse                            colon. The polyps were 4 to 5 mm in size. These                            polyps were removed with a cold snare. Resection  was complete, but the polyp tissue was only                            partially retrieved.                           A few small-mouthed diverticula were found in the                            sigmoid colon and descending colon.                           Internal hemorrhoids were found during retroflexion. Complications:            No immediate complications. Estimated Blood Loss:     Estimated blood loss was minimal. Impression:               - Preparation of the colon was poor clearing to                            fair. Smaller lesions could have been missed.                           - Two 4 to 5 mm polyps in the transverse colon,                            removed with a cold snare. Complete resection.                            Partial retrieval.                           - Diverticulosis in the sigmoid colon and in the                             descending colon.                           - Internal hemorrhoids. Recommendation:           - Patient has a contact number available for                            emergencies. The signs and symptoms of potential                            delayed complications were discussed with the                            patient. Return to normal activities tomorrow.                            Written discharge instructions were provided to the                            patient.                           -  Resume previous diet.                           - Continue present medications.                           - Await pathology results.                           - Repeat colonoscopy in 1 year for surveillance and                            due to preparation. 2 DAY PREP. Jerene Bears, MD 07/25/2020 11:25:04 AM This report has been signed electronically.

## 2020-07-25 NOTE — Progress Notes (Signed)
Vs by CW in adm  Pt's states no medical or surgical changes since previsit or office visit.     

## 2020-07-25 NOTE — Progress Notes (Signed)
PT taken to PACU. Monitors in place. VSS. Report given to RN. 

## 2020-07-25 NOTE — Patient Instructions (Signed)
Please read handouts provided. Continue present medications. Await pathology results. Repeat colonoscopy in 1 year.     YOU HAD AN ENDOSCOPIC PROCEDURE TODAY AT [facility]:   Refer to the procedure report that was given to you for any specific questions about what was found during the examination.  If the procedure report does not answer your questions, please call your gastroenterologist to clarify.  If you requested that your care partner not be given the details of your procedure findings, then the procedure report has been included in a sealed envelope for you to review at your convenience later.  YOU SHOULD EXPECT: Some feelings of bloating in the abdomen. Passage of more gas than usual.  Walking can help get rid of the air that was put into your GI tract during the procedure and reduce the bloating. If you had a lower endoscopy (such as a colonoscopy or flexible sigmoidoscopy) you may notice spotting of blood in your stool or on the toilet paper. If you underwent a bowel prep for your procedure, you may not have a normal bowel movement for a few days.  Please Note:  You might notice some irritation and congestion in your nose or some drainage.  This is from the oxygen used during your procedure.  There is no need for concern and it should clear up in a day or so.  SYMPTOMS TO REPORT IMMEDIATELY:   Following lower endoscopy (colonoscopy or flexible sigmoidoscopy):  Excessive amounts of blood in the stool  Significant tenderness or worsening of abdominal pains  Swelling of the abdomen that is new, acute  Fever of 100F or higher   For urgent or emergent issues, a gastroenterologist can be reached at any hour by calling [phone]. Do not use MyChart messaging for urgent concerns.    DIET:  We do recommend a small meal at first, but then you may proceed to your regular diet.  Drink plenty of fluids but you should avoid alcoholic beverages for 24  hours.  ACTIVITY:  You should plan to take it easy for the rest of today and you should NOT DRIVE or use heavy machinery until tomorrow (because of the sedation medicines used during the test).    FOLLOW UP: Our staff will call the number listed on your records 48-72 hours following your procedure to check on you and address any questions or concerns that you may have regarding the information given to you following your procedure. If we do not reach you, we will leave a message.  We will attempt to reach you two times.  During this call, we will ask if you have developed any symptoms of COVID 19. If you develop any symptoms (ie: fever, flu-like symptoms, shortness of breath, cough etc.) before then, please call [phone].  If you test positive for Covid 19 in the 2 weeks post procedure, please call and report this information to [place].    If any biopsies were taken you will be contacted by phone or by letter within the next 1-3 weeks.  Please call us at [phone] if you have not heard about the biopsies in 3 weeks.    SIGNATURES/CONFIDENTIALITY: You and/or your care partner have signed paperwork which will be entered into your electronic medical record.  These signatures attest to the fact that that the information above on your After Visit Summary has been reviewed and is understood.  Full responsibility of the confidentiality of this discharge information lies with you and/or your  care-partner.

## 2020-07-27 ENCOUNTER — Telehealth: Payer: Self-pay

## 2020-07-27 ENCOUNTER — Telehealth: Payer: Self-pay | Admitting: *Deleted

## 2020-07-27 NOTE — Telephone Encounter (Signed)
  Follow up Call-  Call back number 07/25/2020  Post procedure Call Back phone  # [phone]  Permission to leave phone message Yes  Some recent data might be hidden     First attempt for follow up phone call. No answer at number given.  Left message on voicemail.   Only 1 f/u call to patient d/t phone outage.

## 2020-07-27 NOTE — Telephone Encounter (Signed)
Attempted f/u call. No answer left VM.  

## 2020-08-02 ENCOUNTER — Encounter: Payer: Self-pay | Admitting: Internal Medicine

## 2020-09-04 ENCOUNTER — Other Ambulatory Visit: Payer: Self-pay | Admitting: Family Medicine

## 2020-09-04 DIAGNOSIS — Z1231 Encounter for screening mammogram for malignant neoplasm of breast: Secondary | ICD-10-CM

## 2020-09-12 ENCOUNTER — Encounter: Payer: Self-pay | Admitting: Family Medicine

## 2020-09-12 ENCOUNTER — Other Ambulatory Visit: Payer: Self-pay

## 2020-09-12 ENCOUNTER — Ambulatory Visit (INDEPENDENT_AMBULATORY_CARE_PROVIDER_SITE_OTHER): Payer: Medicare Other | Admitting: Family Medicine

## 2020-09-12 VITALS — BP 122/77 | HR 68 | Ht 65.0 in | Wt 143.2 lb

## 2020-09-12 DIAGNOSIS — R7309 Other abnormal glucose: Secondary | ICD-10-CM

## 2020-09-12 DIAGNOSIS — R7303 Prediabetes: Secondary | ICD-10-CM | POA: Diagnosis not present

## 2020-09-12 DIAGNOSIS — L720 Epidermal cyst: Secondary | ICD-10-CM

## 2020-09-12 DIAGNOSIS — E785 Hyperlipidemia, unspecified: Secondary | ICD-10-CM | POA: Diagnosis not present

## 2020-09-12 LAB — POCT GLYCOSYLATED HEMOGLOBIN (HGB A1C): Hemoglobin A1C: 5.6 % (ref 4.0–5.6)

## 2020-09-12 NOTE — Assessment & Plan Note (Signed)
A1C remains good. We will stop monitoring for now.

## 2020-09-12 NOTE — Patient Instructions (Signed)
High Cholesterol  High cholesterol is a condition in which the blood has high levels of a white, waxy substance similar to fat (cholesterol). The liver makes all the cholesterol that the body needs. The human body needs small amounts of cholesterol to help build cells. A person gets extra or excess cholesterol from the food that he or she eats. The blood carries cholesterol from the liver to the rest of the body. If you have high cholesterol, deposits (plaques) may build up on the walls of your arteries. Arteries are the blood vessels that carry blood away from your heart. These plaques make the arteries narrow and stiff. Cholesterol plaques increase your risk for heart attack and stroke. Work with your health care provider to keep your cholesterol levels in a healthy range. What increases the risk? The following factors may make you more likely to develop this condition:  Eating foods that are high in animal fat (saturated fat) or cholesterol.  Being overweight.  Not getting enough exercise.  A family history of high cholesterol (familial hypercholesterolemia).  Use of tobacco products.  Having diabetes. What are the signs or symptoms? There are no symptoms of this condition. How is this diagnosed? This condition may be diagnosed based on the results of a blood test.  If you are older than 76 years of age, your health care provider may check your cholesterol levels every 4-6 years.  You may be checked more often if you have high cholesterol or other risk factors for heart disease. The blood test for cholesterol measures:  "Bad" cholesterol, or LDL cholesterol. This is the main type of cholesterol that causes heart disease. The desired level is less than 100 mg/dL.  "Good" cholesterol, or HDL cholesterol. HDL helps protect against heart disease by cleaning the arteries and carrying the LDL to the liver for processing. The desired level for HDL is 60 mg/dL or higher.  Triglycerides.  These are fats that your body can store or burn for energy. The desired level is less than 150 mg/dL.  Total cholesterol. This measures the total amount of cholesterol in your blood and includes LDL, HDL, and triglycerides. The desired level is less than 200 mg/dL. How is this treated? This condition may be treated with:  Diet changes. You may be asked to eat foods that have more fiber and less saturated fats or added sugar.  Lifestyle changes. These may include regular exercise, maintaining a healthy weight, and quitting use of tobacco products.  Medicines. These are given when diet and lifestyle changes have not worked. You may be prescribed a statin medicine to help lower your cholesterol levels. Follow these instructions at home: Eating and drinking  Eat a healthy, balanced diet. This diet includes: ? Daily servings of a variety of fresh, frozen, or canned fruits and vegetables. ? Daily servings of whole grain foods that are rich in fiber. ? Foods that are low in saturated fats and trans fats. These include poultry and fish without skin, lean cuts of meat, and low-fat dairy products. ? A variety of fish, especially oily fish that contain omega-3 fatty acids. Aim to eat fish at least 2 times a week.  Avoid foods and drinks that have added sugar.  Use healthy cooking methods, such as roasting, grilling, broiling, baking, poaching, steaming, and stir-frying. Do not fry your food except for stir-frying.   Lifestyle  Get regular exercise. Aim to exercise for a total of 150 minutes a week. Increase your activity level by doing activities   such as gardening, walking, and taking the stairs.  Do not use any products that contain nicotine or tobacco, such as cigarettes, e-cigarettes, and chewing tobacco. If you need help quitting, ask your health care provider.   General instructions  Take over-the-counter and prescription medicines only as told by your health care provider.  Keep all  follow-up visits as told by your health care provider. This is important. Where to find more information  American Heart Association: www.heart.org  National Heart, Lung, and Blood Institute: www.nhlbi.nih.gov Contact a health care provider if:  You have trouble achieving or maintaining a healthy diet or weight.  You are starting an exercise program.  You are unable to stop smoking. Get help right away if:  You have chest pain.  You have trouble breathing.  You have any symptoms of a stroke. "BE FAST" is an easy way to remember the main warning signs of a stroke: ? B - Balance. Signs are dizziness, sudden trouble walking, or loss of balance. ? E - Eyes. Signs are trouble seeing or a sudden change in vision. ? F - Face. Signs are sudden weakness or numbness of the face, or the face or eyelid drooping on one side. ? A - Arms. Signs are weakness or numbness in an arm. This happens suddenly and usually on one side of the body. ? S - Speech. Signs are sudden trouble speaking, slurred speech, or trouble understanding what people say. ? T - Time. Time to call emergency services. Write down what time symptoms started.  You have other signs of a stroke, such as: ? A sudden, severe headache with no known cause. ? Nausea or vomiting. ? Seizure. These symptoms may represent a serious problem that is an emergency. Do not wait to see if the symptoms will go away. Get medical help right away. Call your local emergency services (911 in the [place] Do not drive yourself to the hospital. Summary  Cholesterol plaques increase your risk for heart attack and stroke. Work with your health care provider to keep your cholesterol levels in a healthy range.  Eat a healthy, balanced diet, get regular exercise, and maintain a healthy weight.  Do not use any products that contain nicotine or tobacco, such as cigarettes, e-cigarettes, and chewing tobacco.  Get help right away if you have any symptoms of a  stroke. This information is not intended to replace advice given to you by your health care provider. Make sure you discuss any questions you have with your health care provider. Document Revised: 03/08/2019 Document Reviewed: 03/08/2019 Elsevier Patient Education  2021 Elsevier Inc.  

## 2020-09-12 NOTE — Assessment & Plan Note (Signed)
Diet controlled. She will return in May for [place].

## 2020-09-12 NOTE — Progress Notes (Signed)
    SUBJECTIVE:   CHIEF COMPLAINT / HPI:   Pubic Cyst: INTERMITTENT,  Right groin, uncomfortable, no discharge, no inflammation.  HLD:Not on meds. Diet controlled.  PreDM:No hyperglycemic symptoms. Here for f/u.  PR:XYVO vaccine update  PERTINENT  PMH / PSH: PMX reviewed.  OBJECTIVE:   Vitals:   09/12/20 0914  BP: 122/77  Pulse: 68  SpO2: 98%  Weight: 143 lb 3.2 oz (65 kg)  Height: 5\' 5"  (1.651 m)     Physical Exam Vitals and nursing note reviewed. Exam conducted with a chaperone present Lavell Anchors).  Cardiovascular:     Rate and Rhythm: Normal rate and regular rhythm.     Heart sounds: Normal heart sounds. No murmur heard.   Pulmonary:     Effort: Pulmonary effort is normal. No respiratory distress.     Breath sounds: Normal breath sounds. No wheezing.  Abdominal:     General: Bowel sounds are normal.     Palpations: Abdomen is soft.     Tenderness: There is no abdominal tenderness.  Genitourinary:    Comments: Firm, nodular lesion about 1cm by 1.5cm with a dark head on her mons pubis on the right side. A few inches below her suprapubic area. No tenderness, no erythema. Musculoskeletal:     Right lower leg: No edema.     Left lower leg: No edema.      ASSESSMENT/PLAN:  Inclusion cyst: No signs of infection. Instruction given to schedule derm clinic appointment for removal. She will let me know if she is unable to get an earlier appointment. May use warm compress as needed.  Hyperlipidemia Diet controlled. She will return in May for [place].  Prediabetes A1C remains good. We will stop monitoring for now.   HM: Declined COVID and Pnemovax shots.  Andrena Mews, MD [facility]

## 2020-09-15 ENCOUNTER — Ambulatory Visit (INDEPENDENT_AMBULATORY_CARE_PROVIDER_SITE_OTHER): Payer: Medicare Other | Admitting: Family Medicine

## 2020-09-15 ENCOUNTER — Encounter: Payer: Self-pay | Admitting: Family Medicine

## 2020-09-15 ENCOUNTER — Other Ambulatory Visit: Payer: Self-pay

## 2020-09-15 DIAGNOSIS — L720 Epidermal cyst: Secondary | ICD-10-CM | POA: Diagnosis not present

## 2020-09-15 NOTE — Progress Notes (Signed)
    SUBJECTIVE:   CHIEF COMPLAINT / HPI:   Right groin cyst: Here for cyst removal. No complaints.  PERTINENT  PMH / PSH: PMX reviewed  OBJECTIVE:   BP 115/72   Wt 143 lb (64.9 kg)   SpO2 99%   BMI 23.80 kg/m   Physical Exam Vitals and nursing note reviewed.  Constitutional:      General: She is not in acute distress.    Appearance: Normal appearance.  Genitourinary:    Comments: Approximately 1cm x 1.5cm nodular cyst on her right mons pubis, a few inches inferior to her suprapubis. No tenderness or erythema.  Since this was an off schedule appointment, there was no available chaperone.     ASSESSMENT/PLAN:   Inclusion cyst Excision of Benign Skin Lesion Procedure Note PRE-OP DIAGNOSIS: Inclusion cyst  POST-OP DIAGNOSIS: Same   PROCEDURE: Inclusion cyst excisional removal   Performing Physician: Andrena Mews, MD, MPH  PROCEDURE: Cyst excision - both verbal and written consents were obtained. The skin lesion area was prepared and draped in the usual sterile manner with an alcohol swab and then betadine. Surrounding hair was shaved to create a clear surrounding. The lesion was removed in the usual manner with cyst capsule removal. Hemostasis was assured.   Closure: Two non-absorbable 3-2 sutures placed  Followup: The patient tolerated the procedure well without complications. Standard post-procedure care is explained, and return precautions are given. F/U in 10 days for suture removal. Appointment made.     Andrena Mews, MD [place]

## 2020-09-15 NOTE — Patient Instructions (Signed)
Sutured Wound Care Sutures are stitches that can be used to close wounds. Taking care of your wound properly can help prevent pain and infection. It can also help your wound to heal more quickly. Follow instructions from your doctor about how to care for your sutured wound. Supplies needed:  Soap and water.  A clean bandage (dressing), if needed.  Antibiotic ointment.  A clean towel. How to care for your sutured wound  Keep the wound completely dry for the first 24 hours or as long as told by your doctor. After 24-48 hours, you may shower or bathe as told by your doctor. Do not soak the wound or put the wound completely under water until the sutures have been removed.  After the first 24 hours, clean the wound once a day, or as often as your doctor tells you to. Take these steps: ? Wash the wound with soap and water. ? Rinse the wound with water. Make sure to wash all the soap off. ? Pat the wound dry with a clean towel. Do not rub the wound.  After cleaning the wound, put a thin layer of antibiotic ointment on the wound as told by your doctor. This will help: ? Prevent infection. ? Keep the bandage from sticking to the wound.  Follow instructions from your doctor about how to change your bandage: ? Wash your hands with soap and water. If you cannot use soap and water, use hand sanitizer. ? Change your bandage at least once a day, or as often as told by your doctor. If your dressing gets wet or dirty, change it. ? Leave sutures, skin glue, or skin tape (adhesive) strips in place. They may need to stay in place for 2 weeks or longer. If tape strips get loose and curl up, you may trim the loose edges. Do not remove tape strips completely unless your doctor says it is okay.  Check your wound every day for signs of infection. Watch for: ? Redness, swelling, or pain. ? Fluid or blood. ? Warmth. ? Pus or a bad smell.  Have the sutures removed as told by your doctor.   Follow these  instructions at home: Medicines  Take or apply over-the-counter and prescription medicines only as told by your doctor.  If you were prescribed an antibiotic medicine or ointment, take or apply it as told by your doctor. Do not stop using the antibiotic even if you start to feel better. General instructions  Cover your wound with clothes or put sunscreen on when you are outside. Use a sunscreen of at least 30 SPF.  Do not scratch or pick at your wound.  Avoid stretching your wound.  Raise (elevate) the injured area above the level of your heart while you are sitting or lying down, if possible.  Drink enough fluids to keep your pee (urine) clear or pale yellow.  Keep all follow-up visits as told by your doctor. This is important. Contact a doctor if:  You were given a tetanus shot and you have any of the following at the site where the needle went in: ? Swelling. ? Very bad pain. ? Redness. ? Bleeding.  Your wound breaks open.  You have redness, swelling, or pain around your wound.  You have fluid or blood coming from your wound.  Your wound feels warm to the touch.  You have a fever.  You notice something coming out of your wound, such as wood or glass.  You have pain that does  not get better with medicine.  The skin near your wound changes color.  You need to change your bandage often due to a lot of fluid, blood, or pus coming from the wound.  You get a new rash.  You get numbness around the wound. Get help right away if:  You have very bad swelling around your wound.  You have pus or a bad smell coming from your wound.  Your pain suddenly gets worse and is very bad.  You have painful lumps near your wound or anywhere on your body.  You have a red streak going away from your wound.  The wound is on your hand or foot, and: ? You cannot move a finger or toe as you used to do. ? Your fingers or toes look pale or blue. ? You have numbness that spreads down  your hand, foot, fingers, or toes. Summary  Sutures are stitches that are used to close wounds.  Taking care of your wound properly can help prevent pain and infection.  Keep the wound completely dry for the first 24 hours or for as long as told by your doctor. After 24-48 hours, you may shower or bathe as directed by your doctor. This information is not intended to replace advice given to you by your health care provider. Make sure you discuss any questions you have with your health care provider. Document Revised: 02/04/2020 Document Reviewed: 02/04/2020 Elsevier Patient Education  2021 [employer].

## 2020-09-15 NOTE — Assessment & Plan Note (Signed)
Excision of Benign Skin Lesion Procedure Note PRE-OP DIAGNOSIS: Inclusion cyst  POST-OP DIAGNOSIS: Same   PROCEDURE: Inclusion cyst excisional removal   Performing Physician: Andrena Mews, MD, MPH  PROCEDURE: Cyst excision - both verbal and written consents were obtained. The skin lesion area was prepared and draped in the usual sterile manner with an alcohol swab and then betadine. Surrounding hair was shaved to create a clear surrounding. The lesion was removed in the usual manner with cyst capsule removal. Hemostasis was assured.   Closure: Two non-absorbable 3-2 sutures placed  Followup: The patient tolerated the procedure well without complications. Standard post-procedure care is explained, and return precautions are given. F/U in 10 days for suture removal. Appointment made.

## 2020-09-28 ENCOUNTER — Ambulatory Visit (INDEPENDENT_AMBULATORY_CARE_PROVIDER_SITE_OTHER): Payer: Medicare Other

## 2020-09-28 ENCOUNTER — Other Ambulatory Visit: Payer: Self-pay

## 2020-09-28 DIAGNOSIS — Z4802 Encounter for removal of sutures: Secondary | ICD-10-CM

## 2020-09-28 NOTE — Progress Notes (Signed)
Patient presents to nurse clinic for suture removal from cyst removal of right groin on 5/27.  Page Nicki Reaper, CMA present as chaperone for first attempt. When attempting to remove suture, patient reports significant pain. Site was unremarkable, other than small amount of scar tissue that had developed.   Due to patient's complaints, asked Dr. Owens Shark (preceptor) for further assistance.   Dr. Owens Shark and this RN was present for suture removal. Cold spray was used as numbing agent for removal.   Dr. Owens Shark removed two sutures from patient's right groin. Provided with return precautions.   Forwarding to PCP.   Talbot Grumbling, RN

## 2020-10-24 ENCOUNTER — Ambulatory Visit (INDEPENDENT_AMBULATORY_CARE_PROVIDER_SITE_OTHER): Payer: Medicare Other | Admitting: Family Medicine

## 2020-10-24 ENCOUNTER — Other Ambulatory Visit: Payer: Self-pay

## 2020-10-24 VITALS — BP 116/72 | HR 62 | Ht 65.0 in | Wt 141.4 lb

## 2020-10-24 DIAGNOSIS — Y9302 Activity, running: Secondary | ICD-10-CM

## 2020-10-24 DIAGNOSIS — M7652 Patellar tendinitis, left knee: Secondary | ICD-10-CM | POA: Diagnosis not present

## 2020-10-24 DIAGNOSIS — M7651 Patellar tendinitis, right knee: Secondary | ICD-10-CM

## 2020-10-24 NOTE — Patient Instructions (Addendum)
It was nice seeing you today!  I am glad you are feeling better. Remember to stretch before exercising.  Make sure to follow-up with your PCP for your other health issues.  Stay well, Zola Button, MD [place] [phone]

## 2020-10-24 NOTE — Progress Notes (Signed)
    SUBJECTIVE:   CHIEF COMPLAINT / HPI:   Knee and ankle pain Used to be a runner and tried to run again recently 2 weeks ago. Reports pain her knees and ankles, primarily along bilateral medial knees. Has been using Biofreeze OTC for pain. Was having severe pain and difficulty walking but has overall been improving since then and was able to walk to the clinic this morning Still feeling some mild pain/tingling in her knees. Might give up running but unsure   PERTINENT  PMH / PSH: osteoporosis  OBJECTIVE:   BP 116/72   Pulse 62   Ht 5\' 5"  (1.651 m)   Wt 141 lb 6.4 oz (64.1 kg)   SpO2 98%   BMI 23.53 kg/m   General: Elderly female, NAD CV: RRR, no murmurs Pulm: CTAB, no wheezes or rales MSK: mild tenderness to palpation along bilateral medial knee joints, varus/valgus stress testing negative, Lachman negative, posterior drawer negative, no significant knee effusion  ASSESSMENT/PLAN:   Patellar tendinitis Significantly improved since onset. Continue supportive care, rest until fully recovered. Recommended stretching prior to exercising when recovered.   HCM - Tdap declined  Zola Button, MD [place]

## 2020-10-31 ENCOUNTER — Ambulatory Visit
Admission: RE | Admit: 2020-10-31 | Discharge: 2020-10-31 | Disposition: A | Discharge status: Home / Self Care | Payer: Medicare Other | Source: Ambulatory Visit | Attending: Family Medicine | Admitting: Family Medicine

## 2020-10-31 ENCOUNTER — Other Ambulatory Visit: Payer: Self-pay

## 2020-10-31 DIAGNOSIS — Z1231 Encounter for screening mammogram for malignant neoplasm of breast: Secondary | ICD-10-CM

## 2020-11-07 ENCOUNTER — Ambulatory Visit: Payer: Medicare Other | Admitting: Family Medicine

## 2020-12-29 ENCOUNTER — Other Ambulatory Visit: Payer: Self-pay

## 2020-12-29 ENCOUNTER — Emergency Department (HOSPITAL_COMMUNITY)
Admission: EM | Admit: 2020-12-29 | Discharge: 2020-12-30 | Disposition: A | Discharge status: Home / Self Care | Payer: Medicare Other | Attending: Emergency Medicine | Admitting: Emergency Medicine

## 2020-12-29 ENCOUNTER — Emergency Department (HOSPITAL_COMMUNITY): Payer: Medicare Other

## 2020-12-29 DIAGNOSIS — Z87891 Personal history of nicotine dependence: Secondary | ICD-10-CM | POA: Diagnosis not present

## 2020-12-29 DIAGNOSIS — R42 Dizziness and giddiness: Secondary | ICD-10-CM | POA: Diagnosis not present

## 2020-12-29 DIAGNOSIS — R079 Chest pain, unspecified: Secondary | ICD-10-CM | POA: Diagnosis not present

## 2020-12-29 DIAGNOSIS — R202 Paresthesia of skin: Secondary | ICD-10-CM | POA: Insufficient documentation

## 2020-12-29 LAB — CBC WITH DIFFERENTIAL/PLATELET
Abs Immature Granulocytes: 0.02 10*3/uL (ref 0.00–0.07)
Basophils Absolute: 0.1 10*3/uL (ref 0.0–0.1)
Basophils Relative: 1 %
Eosinophils Absolute: 0.1 10*3/uL (ref 0.0–0.5)
Eosinophils Relative: 2 %
HCT: 41 % (ref 36.0–46.0)
Hemoglobin: 13.2 g/dL (ref 12.0–15.0)
Immature Granulocytes: 0 %
Lymphocytes Relative: 26 %
Lymphs Abs: 2 10*3/uL (ref 0.7–4.0)
MCH: 29.5 pg (ref 26.0–34.0)
MCHC: 32.2 g/dL (ref 30.0–36.0)
MCV: 91.7 fL (ref 80.0–100.0)
Monocytes Absolute: 0.8 10*3/uL (ref 0.1–1.0)
Monocytes Relative: 10 %
Neutro Abs: 4.8 10*3/uL (ref 1.7–7.7)
Neutrophils Relative %: 61 %
Platelets: 320 10*3/uL (ref 150–400)
RBC: 4.47 MIL/uL (ref 3.87–5.11)
RDW: 14.1 % (ref 11.5–15.5)
WBC: 7.8 10*3/uL (ref 4.0–10.5)
nRBC: 0 % (ref 0.0–0.2)

## 2020-12-29 LAB — COMPREHENSIVE METABOLIC PANEL
ALT: 16 U/L (ref 0–44)
AST: 22 U/L (ref 15–41)
Albumin: 4 g/dL (ref 3.5–5.0)
Alkaline Phosphatase: 68 U/L (ref 38–126)
Anion gap: 6 (ref 5–15)
BUN: 8 mg/dL (ref 8–23)
CO2: 26 mmol/L (ref 22–32)
Calcium: 9.6 mg/dL (ref 8.9–10.3)
Chloride: 109 mmol/L (ref 98–111)
Creatinine, Ser: 0.94 mg/dL (ref 0.44–1.00)
GFR, Estimated: >60 mL/min (ref >60)
Glucose, Bld: 98 mg/dL (ref 70–99)
Potassium: 4.6 mmol/L (ref 3.5–5.1)
Sodium: 141 mmol/L (ref 135–145)
Total Bilirubin: 0.5 mg/dL (ref 0.3–1.2)
Total Protein: 6.9 g/dL (ref 6.5–8.1)

## 2020-12-29 LAB — TROPONIN I (HIGH SENSITIVITY): Troponin I (High Sensitivity): 9 ng/L (ref <18)

## 2020-12-29 NOTE — ED Triage Notes (Signed)
Pt c/o intermittent lightheadedness, L arm going numb. States has been occurring off & on for "a couple weeks," states she noticed it especially when doing yard work last week. States she feels like she could be dehydrated

## 2020-12-29 NOTE — ED Provider Notes (Signed)
Emergency Medicine Provider Triage Evaluation Note  Sherry Wilkinson , a 75 y.o. female  was evaluated in triage.  Pt complains of sided chest pain into her left arm.  She states this started at about noon today while she was moving close.  She had a similar episode a week ago that happened while she was raking.  She feels lightheaded, denies dizziness or feeling like things are spinning and moving.  She has a abnormal feeling in the left side of her chest that radiates down into her left arm.  She denies any weakness..  Review of Systems  Positive: Chest pain, left arm pain Negative: fevers  Physical Exam  BP (!) 112/58 (BP Location: Left Arm)   Pulse 71   Temp 98.4 F (36.9 C) (Oral)   Resp 18   SpO2 100%  Gen:   Awake, no distress   Resp:  Normal effort  MSK:   Moves extremities without difficulty  Other:  Bilateral hands are warm and well-perfused.  Medical Decision Making  Medically screening exam initiated at 5:00 PM.  Appropriate orders placed.  Yasira B Kaas was informed that the remainder of the evaluation will be completed by another provider, this initial triage assessment does not replace that evaluation, and the importance of remaining in the ED until their evaluation is complete.  Note: Portions of this report may have been transcribed using voice recognition software. Every effort was made to ensure accuracy; however, inadvertent computerized transcription errors may be present    Lorin Glass, PA-C 12/29/20 1706    Drenda Freeze, MD 12/29/20 2308

## 2020-12-30 NOTE — ED Notes (Signed)
Pt ambulated in hallway without difficulties. O2 sat stayed 100% while ambulating

## 2020-12-30 NOTE — ED Provider Notes (Signed)
[facility] EMERGENCY DEPARTMENT Provider Note   CSN: [id number] Arrival date & time: 12/29/20  1600     History Chief Complaint  Patient presents with   Dizziness   Numbness    Sherry Wilkinson is a 75 y.o. female.  75 yo F w/ history as documented below who presents to the ED with a couple episodes of arm paresthesias/numbness along with lightheadedness. Has a history of staying in good shape, walking/running multiple miles. Has had episodic light headedness in the past related to her anemia but no other issues. One time she did have some sharp shooting pain across her left chest with it but no other associated ysmptoms such as dyspnea, diaphoresis, vomiting. No fever, cough or rash.    Dizziness     Past Medical History:  Diagnosis Date   Anemia    Appendicitis    Arthritis    all over - feet and knees especially    Cataract    Liver cyst    Osteopenia    Paresthesia 10/10/2017   Perineal abscess 12/16/2014    Patient Active Problem List   Diagnosis Date Noted   Inclusion cyst 09/15/2020   Hyperlipidemia 09/12/2020   Weight loss 03/30/2019   Anemia 08/21/2018   Vitamin B 12 deficiency 10/28/2017   Nail discoloration 10/10/2017   Hemorrhoid 01/24/2017   Osteoporosis 08/13/2016   Liver cyst 08/04/2012    Past Surgical History:  Procedure Laterality Date   APPENDECTOMY  1970   COLONOSCOPY     POLYPECTOMY     UTERINE FIBROID SURGERY     WRIST SURGERY       OB History     Gravida  3   Para  0   Term  0   Preterm  0   AB  3   Living         SAB  3   IAB  0   Ectopic      Multiple      Live Births              Family History  Problem Relation Age of Onset   Heart disease Mother    Heart disease Maternal Grandmother    Colon cancer Neg Hx    Esophageal cancer Neg Hx    Rectal cancer Neg Hx    Stomach cancer Neg Hx    Colon polyps Neg Hx     Social History   Tobacco Use   Smoking status: Former     Types: Cigarettes    Quit date: 08/21/1998    Years since quitting: 22.3   Smokeless tobacco: Never  Substance Use Topics   Alcohol use: No   Drug use: No    Home Medications Prior to Admission medications   Medication Sig Start Date End Date Taking? Authorizing Provider  Cholecalciferol (VITAMIN D3) 50 MCG (2000 UT) TABS Take 1 tablet by mouth.    [provider]  diclofenac Sodium (VOLTAREN) 1 % GEL Apply 2 g topically 4 (four) times daily as needed. Patient not taking: Reported on 09/12/2020 04/27/20   Margette Fast, MD  ferrous sulfate 325 (65 FE) MG EC tablet Take 325 mg by mouth every morning.    [provider]  vitamin B-12 (CYANOCOBALAMIN) 500 MCG tablet Take 500 mcg by mouth daily.    [provider]    Allergies    Patient has no known allergies.  Review of Systems   Review of Systems  Neurological:  Positive for dizziness.  All other systems reviewed and are negative.  Physical Exam Updated Vital Signs BP 132/62   Pulse (!) 44   Temp 97.9 F (36.6 C) (Oral)   Resp 17   SpO2 99%   Physical Exam Vitals and nursing note reviewed.  Constitutional:      Appearance: She is well-developed.  HENT:     Head: Normocephalic and atraumatic.     Comments: No jvd, neck swelling or plethora    Nose: Nose normal. No congestion or rhinorrhea.     Mouth/Throat:     Mouth: Mucous membranes are moist.     Pharynx: Oropharynx is clear.  Eyes:     Pupils: Pupils are equal, round, and reactive to light.  Cardiovascular:     Rate and Rhythm: Normal rate and regular rhythm.  Pulmonary:     Effort: No respiratory distress.     Breath sounds: No stridor.  Abdominal:     General: Abdomen is flat. There is no distension.  Musculoskeletal:        General: No swelling or tenderness. Normal range of motion.     Cervical back: Normal range of motion.  Skin:    General: Skin is warm and dry.  Neurological:     General: No focal deficit present.      Mental Status: She is alert.     Comments: Good grip strength, elbow flexion/extension, shoulder abduction and adduction    ED Results / Procedures / Treatments   Labs (all labs ordered are listed, but only abnormal results are displayed) Labs Reviewed  COMPREHENSIVE METABOLIC PANEL  CBC WITH DIFFERENTIAL/PLATELET  TROPONIN I (HIGH SENSITIVITY)  TROPONIN I (HIGH SENSITIVITY)    EKG EKG Interpretation  Date/Time:  Friday December 29 2020 16:26:19 EDT Ventricular Rate:  71 PR Interval:  154 QRS Duration: 74 QT Interval:  366 QTC Calculation: 397 R Axis:   18 Text Interpretation: Normal sinus rhythm Cannot rule out Anterior infarct , age undetermined Abnormal ECG Confirmed by Merrily Pew [phone]) on 12/30/2020 1:01:06 AM  Radiology DG Chest 2 View  Result Date: 12/29/2020 CLINICAL DATA:  Chest pain. EXAM: CHEST - 2 VIEW COMPARISON:  Chest x-ray 08/13/2016.  CT chest 09/02/2018. FINDINGS: [name] IMPRESSION: No active cardiopulmonary disease. Electronically Signed   By: Ronney Asters M.D.   On: 12/29/2020 18:44    Procedures Procedures   Medications Ordered in ED Medications - No data to display  ED Course  I have reviewed the triage vital signs and the nursing notes.  Pertinent labs & imaging results that were available during my care of the patient were reviewed by me and considered in my medical decision making (see chart for details).    MDM Rules/Calculators/A&P                         Work-up is reassuring.  Patient has no evidence of ACS, stroke or other emergent causes for symptoms.  Patient was able to tolerate p.o. without difficulty.  She is able to walk around the department without difficulty.  Neurologic exam remains intact.  She has no recurrent symptoms here in the emergency room.  Unclear on the etiology will follow-up with her PCP for further management.  Final Clinical Impression(s) / ED Diagnoses Final diagnoses:  Paresthesia  Dizziness    Rx / DC  Orders ED Discharge Orders     None        Bali Lyn, Corene Cornea,  MD 12/31/20 0335

## 2021-02-02 ENCOUNTER — Encounter: Payer: Self-pay | Admitting: Family Medicine

## 2021-02-02 ENCOUNTER — Ambulatory Visit (INDEPENDENT_AMBULATORY_CARE_PROVIDER_SITE_OTHER): Payer: Medicare Other | Admitting: Family Medicine

## 2021-02-02 ENCOUNTER — Other Ambulatory Visit: Payer: Self-pay

## 2021-02-02 VITALS — BP 117/67 | HR 71 | Ht 65.0 in | Wt 141.4 lb

## 2021-02-02 DIAGNOSIS — E538 Deficiency of other specified B group vitamins: Secondary | ICD-10-CM

## 2021-02-02 DIAGNOSIS — R202 Paresthesia of skin: Secondary | ICD-10-CM | POA: Diagnosis not present

## 2021-02-02 DIAGNOSIS — R634 Abnormal weight loss: Secondary | ICD-10-CM

## 2021-02-02 DIAGNOSIS — E785 Hyperlipidemia, unspecified: Secondary | ICD-10-CM

## 2021-02-02 NOTE — Assessment & Plan Note (Signed)
FLP checked today. I will contact her with the result.

## 2021-02-02 NOTE — Progress Notes (Signed)
    SUBJECTIVE:   CHIEF COMPLAINT / HPI:   Weight loss: She has endorsed weight loss over the past few months. Her sister passed away a month ago, and her appetite declined during that period. She denies having a poor appetite but gets full quickly when she eats--no GI symptoms.  Paresthesia: She was seen at the ED about a month ago for numbness and tingling in her left UL, which has since resolved. She remembers stopping her vitamin supplement at the time. Since she resumed her supplements, her symptoms have subsided. She is on a strict vegetarian diet. She would like to f/u with her vitamin B12 deficiency.  Grief: She lost her older sister 1 month ago. Endorses feeling good and seek comfort from God.  HLD: Diet control. Here for f/u.  PERTINENT  PMH / PSH: PMX reviewed  OBJECTIVE:   BP 117/67   Pulse 71   Ht 5\' 5"  (1.651 m)   Wt 141 lb 6.4 oz (64.1 kg)   SpO2 100%   BMI 23.53 kg/m   Physical Exam Vitals and nursing note reviewed.  Cardiovascular:     Rate and Rhythm: Normal rate and regular rhythm.     Heart sounds: Normal heart sounds. No murmur heard. Pulmonary:     Effort: Pulmonary effort is normal. No respiratory distress.     Breath sounds: Normal breath sounds. No wheezing or rhonchi.  Abdominal:     General: Abdomen is flat. Bowel sounds are normal. There is no distension.     Palpations: There is no mass.     Tenderness: There is no abdominal tenderness.  Musculoskeletal:     Right lower leg: No edema.     Left lower leg: No edema.  Neurological:     General: No focal deficit present.     Cranial Nerves: Cranial nerves are intact.     Sensory: Sensation is intact.     Motor: Motor function is intact.     Coordination: Coordination is intact.     Deep Tendon Reflexes: Reflexes are normal and symmetric.     ASSESSMENT/PLAN:   Weight loss Weight trend reviewed. She is actually stable since July of 2022, I.e 3 months ago. She is up to date with her  cancer screening. I discussed checking HIV and RPR, but she declined. Adding nutrition supplement such as ensure to her diet discussed. F/U in 4 weeks for reassessment. She verbalized understanding.  Paresthesia Recent ED visit and ED record reviewed during this encounter. Likely related to her vitamin B12 deficiency. Labs ordered today are TSH, and anemia panel. Continue MVI supplements.   Vitamin B 12 deficiency Labs rechecked today. Continue supplements.  Hyperlipidemia FLP checked today. I will contact her with the result.    Grief: recently lost her sister. Grief counselor referral offered. She declined as her faith in God is more than a [employer]. F/U as needed.  She declined both Tdap and flu shot  Andrena Mews, MD [place]

## 2021-02-02 NOTE — Assessment & Plan Note (Addendum)
Recent ED visit and ED record reviewed during this encounter. Likely related to her vitamin B12 deficiency. Labs ordered today are TSH, and anemia panel. Continue MVI supplements.

## 2021-02-02 NOTE — Patient Instructions (Signed)
Paresthesia Paresthesia is a burning or prickling feeling. This feeling can happen in any part of the body. It often happens in the hands, arms, legs, or feet. Usually, it is not painful. In most cases, the feeling goes away in a short time and is not a sign of a serious problem. If you have paresthesia that lasts a longtime, you need to be seen by your doctor. Follow these instructions at home: Alcohol use  Do not drink alcohol if: Your doctor tells you not to drink. You are pregnant, may be pregnant, or are planning to become pregnant. If you drink alcohol: Limit how much you use to: 0-1 drink a day for women. 0-2 drinks a day for men. Be aware of how much alcohol is in your drink. In the [place] one drink equals one 12 oz bottle of beer (355 mL), one 5 oz glass of wine (148 mL), or one 1 oz glass of hard liquor (44 mL).  Nutrition  Eat a healthy diet. This includes: Eating foods that have a lot of fiber in them, such as fresh fruits and vegetables, whole grains, and beans. Limiting foods that have a lot of fat and processed sugars in them, such as fried or sweet foods.  General instructions Take over-the-counter and prescription medicines only as told by your doctor. Do not use any products that have nicotine or tobacco in them, such as cigarettes and e-cigarettes. If you need help quitting, ask your doctor. If you have diabetes, work with your doctor to make sure your blood sugar stays in a healthy range. If your feet feel numb: Check for redness, warmth, and swelling every day. Wear padded socks and comfortable shoes. These help protect your feet. Keep all follow-up visits as told by your doctor. This is important. Contact a doctor if: You have paresthesia that gets worse or does not go away. Lose feeling (have numbness) after an injury. Your burning or prickling feeling gets worse when you walk. You have pain or cramps. You feel dizzy or pass out (faint). You have a rash. Get  help right away if you: Feel weak or have new weakness in an arm or leg. Have trouble walking or moving. Have problems speaking, understanding, or seeing. Feel confused. Cannot control when you pee (urinate) or poop (have a bowel movement). Summary Paresthesia is a burning or prickling feeling. It often happens in the hands, arms, legs, or feet. In most cases, the feeling goes away in a short time and is not a sign of a serious problem. If you have paresthesia that lasts a long time, you need to be seen by your doctor. This information is not intended to replace advice given to you by your health care provider. Make sure you discuss any questions you have with your healthcare provider. Document Revised: 01/18/2020 Document Reviewed: 01/18/2020 Elsevier Patient Education  2022 [employer]  

## 2021-02-02 NOTE — Assessment & Plan Note (Signed)
Labs rechecked today. Continue supplements.

## 2021-02-02 NOTE — Assessment & Plan Note (Signed)
Weight trend reviewed. She is actually stable since July of 2022, I.e 3 months ago. She is up to date with her cancer screening. I discussed checking HIV and RPR, but she declined. Adding nutrition supplement such as ensure to her diet discussed. F/U in 4 weeks for reassessment. She verbalized understanding.

## 2021-02-03 LAB — ANEMIA PROFILE B
Basophils Absolute: 0.1 10*3/uL (ref 0.0–0.2)
Basos: 1 %
EOS (ABSOLUTE): 0.1 10*3/uL (ref 0.0–0.4)
Eos: 2 %
Ferritin: 19 ng/mL (ref 15–150)
Folate: 14.2 ng/mL (ref >3.0)
Hematocrit: 38.4 % (ref 34.0–46.6)
Hemoglobin: 12.6 g/dL (ref 11.1–15.9)
Immature Grans (Abs): 0 10*3/uL (ref 0.0–0.1)
Immature Granulocytes: 0 %
Iron Saturation: 22 % (ref 15–55)
Iron: 76 ug/dL (ref 27–139)
Lymphocytes Absolute: 1.8 10*3/uL (ref 0.7–3.1)
Lymphs: 28 %
MCH: 29.6 pg (ref 26.6–33.0)
MCHC: 32.8 g/dL (ref 31.5–35.7)
MCV: 90 fL (ref 79–97)
Monocytes Absolute: 0.7 10*3/uL (ref 0.1–0.9)
Monocytes: 11 %
Neutrophils Absolute: 3.7 10*3/uL (ref 1.4–7.0)
Neutrophils: 58 %
Platelets: 314 10*3/uL (ref 150–450)
RBC: 4.25 x10E6/uL (ref 3.77–5.28)
RDW: 14.2 % (ref 11.7–15.4)
Retic Ct Pct: 1.2 % (ref 0.6–2.6)
Total Iron Binding Capacity: 340 ug/dL (ref 250–450)
UIBC: 264 ug/dL (ref 118–369)
Vitamin B-12: 618 pg/mL (ref 232–1245)
WBC: 6.4 10*3/uL (ref 3.4–10.8)

## 2021-02-03 LAB — TSH: TSH: 3.54 u[IU]/mL (ref 0.450–4.500)

## 2021-02-03 LAB — LIPID PANEL
Chol/HDL Ratio: 2.6 ratio (ref 0.0–4.4)
Cholesterol, Total: 189 mg/dL (ref 100–199)
HDL: 72 mg/dL (ref >39)
LDL Chol Calc (NIH): 101 mg/dL — ABNORMAL HIGH (ref 0–99)
Triglycerides: 86 mg/dL (ref 0–149)
VLDL Cholesterol Cal: 16 mg/dL (ref 5–40)

## 2021-02-05 ENCOUNTER — Telehealth: Payer: Self-pay | Admitting: Family Medicine

## 2021-02-05 NOTE — Telephone Encounter (Signed)
HIPAA compliant callback message left.   Please advise her that her lab test looks good. LDL cholesterol improved. May continue diet control.  Continue Iron and BP supplement as well. F/U as needed/

## 2021-06-26 ENCOUNTER — Encounter: Payer: Self-pay | Admitting: Family Medicine

## 2021-06-26 ENCOUNTER — Other Ambulatory Visit: Payer: Self-pay

## 2021-06-26 ENCOUNTER — Ambulatory Visit (INDEPENDENT_AMBULATORY_CARE_PROVIDER_SITE_OTHER): Payer: Medicare Other | Admitting: Family Medicine

## 2021-06-26 ENCOUNTER — Telehealth: Payer: Self-pay | Admitting: Family Medicine

## 2021-06-26 VITALS — BP 132/73 | HR 63 | Ht 65.0 in | Wt 146.2 lb

## 2021-06-26 DIAGNOSIS — M810 Age-related osteoporosis without current pathological fracture: Secondary | ICD-10-CM

## 2021-06-26 DIAGNOSIS — E2839 Other primary ovarian failure: Secondary | ICD-10-CM

## 2021-06-26 DIAGNOSIS — R238 Other skin changes: Secondary | ICD-10-CM | POA: Diagnosis not present

## 2021-06-26 DIAGNOSIS — D509 Iron deficiency anemia, unspecified: Secondary | ICD-10-CM | POA: Diagnosis not present

## 2021-06-26 NOTE — Assessment & Plan Note (Signed)
Repeat dexa scan ordered. ?Bmet checked. ?Continue vitamin D supplements. ?Consider calcium supplements, pending dexa scan report. ?I will contact her with results. ?She agreed with the plan.  ?

## 2021-06-26 NOTE — Telephone Encounter (Signed)
Patient called and stated when her results come back she would like them mailed to her. ? ?Thanks! ?

## 2021-06-26 NOTE — Progress Notes (Signed)
? ? ?  SUBJECTIVE:  ? ?CHIEF COMPLAINT / HPI:  ? ?HPI ?Osteoporosis: ?She is compliant with her vit D supplement. She is not taking calcium. Here for f/u. ? ?Anemia: ?She requested lab work. She stated that she ate bad over the valentine weekend and had not felt herself.  ? ?Vulva pain: ?Patient stated that she still experiences soreness around the incision site where she got her inclusion cyst removed. Pain aggravated after suture removal she says.  No swelling, or redness. Pain improves by applying warm water. ? ?PERTINENT  PMH / PSH: PMHx reviewed. ? ? ?OBJECTIVE:  ? ?BP 132/73   Pulse 63   Ht '5\' 5"'$  (1.651 m)   Wt 146 lb 3.2 oz (66.3 kg)   SpO2 100%   BMI 24.33 kg/m?   ?Physical Exam ?Vitals and nursing note reviewed. Exam conducted with a chaperone present Cherrie Distance Legette).  ?Cardiovascular:  ?   Rate and Rhythm: Normal rate and regular rhythm.  ?   Heart sounds: Normal heart sounds. No murmur heard. ?Pulmonary:  ?   Effort: Pulmonary effort is normal. No respiratory distress.  ?   Breath sounds: Normal breath sounds.  ?Abdominal:  ?   General: Bowel sounds are normal.  ?   Tenderness: There is no abdominal tenderness.  ?Genitourinary: ?   Comments: Normal appearing vulva and mons pubis. No erythema, swelling. ?Very mild tenderness with palpation of her right vulva ? ? ? ?ASSESSMENT/PLAN:  ? ?Osteoporosis ?Repeat dexa scan ordered. ?Bmet checked. ?Continue vitamin D supplements. ?Consider calcium supplements, pending dexa scan report. ?I will contact her with results. ?She agreed with the plan.  ? ?Anemia ?Anemia profile B ordered. ?I will contact her with her result. ?  ?Vulva skin Irritation: ?Benign exam. ?Monitor closely for improvement. ?Consider U/S to further assess in the future if there is no improvement. ?She agreed with the plan.  ? ?Andrena Mews, MD ?[place]  ? ?

## 2021-06-26 NOTE — Patient Instructions (Signed)
Bone Density Test ?A bone density test uses a type of X-ray to measure the amount of calcium and other minerals in a person's bones. It can measure bone density in the hip and the spine. The test is similar to having a regular X-ray. This test may also be called: ?Bone densitometry. ?Bone mineral density test. ?Dual-energy X-ray absorptiometry (DEXA). ?You may have this test to: ?Diagnose a condition that causes weak or thin bones (osteoporosis). ?Screen you for osteoporosis. ?Predict your risk for a broken bone (fracture). ?Determine how well your osteoporosis treatment is working. ?Tell a health care provider about: ?Any allergies you have. ?All medicines you are taking, including vitamins, herbs, eye drops, creams, and over-the-counter medicines. ?Any problems you or family members have had with anesthetic medicines. ?Any blood disorders you have. ?Any surgeries you have had. ?Any medical conditions you have. ?Whether you are pregnant or may be pregnant. ?Any medical tests you have had within the past 14 days that used contrast material. ?What are the risks? ?Generally, this is a safe test. However, it does expose you to a small amount of radiation, which can slightly increase your cancer risk. ?What happens before the test? ?Do not take any calcium supplements within the 24 hours before your test. ?You will need to remove all metal jewelry, eyeglasses, removable dental appliances, and any other metal objects on your body. ?What happens during the test? ? ?You will lie down on an exam table. There will be an X-ray generator below you and an imaging device above you. ?Other devices, such as boxes or braces, may be used to position your body properly for the scan. ?The machine will slowly scan your body. You will need to keep very still while the machine does the scan. ?The images will show up on a screen in the room. Images will be examined by a specialist after your test is finished. ?The procedure may vary among  health care providers and hospitals. ?What can I expect after the test? ?It is up to you to get the results of your test. Ask your health care provider, or the department that is doing the test, when your results will be ready. ?Summary ?A bone density test is an imaging test that uses a type of X-ray to measure the amount of calcium and other minerals in your bones. ?The test may be used to diagnose or screen you for a condition that causes weak or thin bones (osteoporosis), predict your risk for a broken bone (fracture), or determine how well your osteoporosis treatment is working. ?Do not take any calcium supplements within 24 hours before your test. ?Ask your health care provider, or the department that is doing the test, when your results will be ready. ?This information is not intended to replace advice given to you by your health care provider. Make sure you discuss any questions you have with your health care provider. ?Document Revised: 12/20/2020 Document Reviewed: 09/23/2019 ?Elsevier Patient Education ? [employer]. ? ?

## 2021-06-26 NOTE — Assessment & Plan Note (Signed)
Anemia profile B ordered. ?I will contact her with her result. ?

## 2021-06-27 ENCOUNTER — Encounter: Payer: Self-pay | Admitting: Family Medicine

## 2021-06-27 ENCOUNTER — Telehealth: Payer: Self-pay | Admitting: Family Medicine

## 2021-06-27 LAB — ANEMIA PROFILE B
Basophils Absolute: 0.1 10*3/uL (ref 0.0–0.2)
Basos: 1 %
EOS (ABSOLUTE): 0.1 10*3/uL (ref 0.0–0.4)
Eos: 1 %
Ferritin: 19 ng/mL (ref 15–150)
Folate: >20 ng/mL (ref >3.0)
Hematocrit: 38.6 % (ref 34.0–46.6)
Hemoglobin: 12.8 g/dL (ref 11.1–15.9)
Immature Grans (Abs): 0 10*3/uL (ref 0.0–0.1)
Immature Granulocytes: 0 %
Iron Saturation: 19 % (ref 15–55)
Iron: 66 ug/dL (ref 27–139)
Lymphocytes Absolute: 2 10*3/uL (ref 0.7–3.1)
Lymphs: 29 %
MCH: 29.5 pg (ref 26.6–33.0)
MCHC: 33.2 g/dL (ref 31.5–35.7)
MCV: 89 fL (ref 79–97)
Monocytes Absolute: 0.6 10*3/uL (ref 0.1–0.9)
Monocytes: 9 %
Neutrophils Absolute: 4.1 10*3/uL (ref 1.4–7.0)
Neutrophils: 60 %
Platelets: 294 10*3/uL (ref 150–450)
RBC: 4.34 x10E6/uL (ref 3.77–5.28)
RDW: 13.2 % (ref 11.7–15.4)
Retic Ct Pct: 1.4 % (ref 0.6–2.6)
Total Iron Binding Capacity: 349 ug/dL (ref 250–450)
UIBC: 283 ug/dL (ref 118–369)
Vitamin B-12: 766 pg/mL (ref 232–1245)
WBC: 6.9 10*3/uL (ref 3.4–10.8)

## 2021-06-27 LAB — BASIC METABOLIC PANEL
BUN/Creatinine Ratio: 7 — ABNORMAL LOW (ref 12–28)
BUN: 6 mg/dL — ABNORMAL LOW (ref 8–27)
CO2: 24 mmol/L (ref 20–29)
Calcium: 9.2 mg/dL (ref 8.7–10.3)
Chloride: 106 mmol/L (ref 96–106)
Creatinine, Ser: 0.9 mg/dL (ref 0.57–1.00)
Glucose: 84 mg/dL (ref 70–99)
Potassium: 3.9 mmol/L (ref 3.5–5.2)
Sodium: 145 mmol/L — ABNORMAL HIGH (ref 134–144)
eGFR: 66 mL/min/{1.73_m2} (ref >59)

## 2021-06-27 NOTE — Telephone Encounter (Signed)
HIPAA compliant callback messages left on both listed phone numbers. ? ?Please advise her that her sodium level is very mildly elevated. May be due to dehydration. Get well hydration and follow-up soon for recheck as needed. ?Her other labs looks good. ? ?NB: Result letter also sent. ?

## 2021-09-20 ENCOUNTER — Other Ambulatory Visit: Payer: Self-pay | Admitting: Family Medicine

## 2021-09-20 DIAGNOSIS — Z1231 Encounter for screening mammogram for malignant neoplasm of breast: Secondary | ICD-10-CM

## 2021-09-21 ENCOUNTER — Encounter: Payer: Self-pay | Admitting: Family Medicine

## 2021-09-21 ENCOUNTER — Ambulatory Visit (INDEPENDENT_AMBULATORY_CARE_PROVIDER_SITE_OTHER): Payer: Medicare Other | Admitting: Family Medicine

## 2021-09-21 VITALS — BP 116/66 | HR 62 | Ht 65.0 in | Wt 146.6 lb

## 2021-09-21 DIAGNOSIS — G8929 Other chronic pain: Secondary | ICD-10-CM | POA: Diagnosis not present

## 2021-09-21 DIAGNOSIS — E870 Hyperosmolality and hypernatremia: Secondary | ICD-10-CM

## 2021-09-21 DIAGNOSIS — M25571 Pain in right ankle and joints of right foot: Secondary | ICD-10-CM | POA: Diagnosis not present

## 2021-09-21 NOTE — Assessment & Plan Note (Signed)
??   OA vs neuropathic pain. I was unable to find a recent ankel xray report on file. I discussed trial of an oral NSAID, but she declined. Monitor for now.

## 2021-09-21 NOTE — Progress Notes (Signed)
    SUBJECTIVE:   CHIEF COMPLAINT / HPI:   Hypernatremia: She is here to f/u with her lab report. No concerns.  Ankle pain: She c/o pain in the medial aspect of her right ankle. She denies trauma. She stated that she had a normal xray done recently at the ED. She could not describe the pain; it sometimes felt like a burning or pressure sensation. Voltaren gel does not help a lot.  I feel better now.   PERTINENT  PMH / PSH: PMHx reviewed  OBJECTIVE:   BP 116/66   Pulse 62   Ht '5\' 5"'$  (1.651 m)   Wt 146 lb 9.6 oz (66.5 kg)   SpO2 99%   BMI 24.40 kg/m   Physical Exam Vitals and nursing note reviewed.  Cardiovascular:     Rate and Rhythm: Normal rate and regular rhythm.     Pulses:          Dorsalis pedis pulses are 2+ on the right side and 2+ on the left side.     Heart sounds: Normal heart sounds. No murmur heard. Pulmonary:     Effort: Pulmonary effort is normal. No respiratory distress.     Breath sounds: Normal breath sounds. No wheezing.  Musculoskeletal:     Right foot: Normal range of motion. No deformity.  Feet:     Comments: No ankle tenderness    ASSESSMENT/PLAN:   Mild hypernatremia: I advised her this could be due to poor hydration or dietary. We can monitor since it is very mildly elevated. However, she requested recheck instead. Bmet ordered. I will call her with her result.  Ankle pain: ?? OA vs neuropathic pain. I was unable to find a recent ankel xray report on file. I discussed trial of an oral NSAID, but she declined. Monitor for now.  Andrena Mews, MD [place]

## 2021-09-21 NOTE — Patient Instructions (Signed)
Hypernatremia Hypernatremia is when your blood has too much salt (sodium) in it. This is an electrolyte disorder. Electrolytes are minerals found in the body's blood and tissues. Salt is one of these minerals. Electrolyte disorders occur when the level of minerals is either too high or too low in the body. Water and salt must be balanced in the blood. When the level of salt is too high, this is serious and can be an emergency. What are the causes? This condition may be caused by: Not drinking enough water. Certain medicines, such as medicines that help remove water from the body (diuretics). Water loss through exercise or sweating. Having watery poop (diarrhea). Vomiting. Getting too much salt, such as from taking salt tablets or salt water drowning. Other causes include conditions that: Affect a person's memory. Cause the kidneys to remove too much pee (urine) from the body. This is often related to diabetes. Cause the body to make too much of a hormone called cortisol. Affect the balance of salt and water in the body. Affect the kidneys. What are the signs or symptoms? Symptoms of this condition may include: Being more thirsty than usual. Peeing more than usual. Muscle twitching or cramps. Feeling dizzy or light-headed. Headache. Feeling tired. Feeling like you may vomit. Vomiting. Having watery poop. Weakness. Very bad symptoms of this condition include: Confusion. Loss of consciousness. Seizures. Coma. How is this treated? This condition may need to be treated right away. In some cases, you may need to stay in the hospital. Treatment may include: Drinking more water. Fluids given through an IV tube put into one of your veins. Changes in the medicines that you are taking. Keeping you from having a fever. Diabetes control. Lab tests will be done to make sure treatment is working. Follow these instructions at home:  Drink enough fluid to keep your pee pale yellow. Take  over-the-counter and prescription medicines only as told by your doctor. Always drink fluids after you exercise. Always drink fluids after you vomit or have watery poop. Keep all follow-up visits. Contact a doctor if: You keep having watery poop. You keep vomiting. You have a fever or chills. You are not able to follow advice from your doctor about drinking fluids. Get help right away if: You feel light-headed and weak. You have a fast heartbeat. You have problems breathing. You get confused. You get restless. You get angry or annoyed easily. You have a seizure. You faint. You have signs of not having enough water in your body (dehydration), such as: Dark pee. Very little or no pee. Cracked lips. No tears. Dry mouth. These symptoms may be an emergency. Get help right away. Call 911. Do not wait to see if the symptoms will go away. Do not drive yourself to the hospital. Summary Hypernatremia is when your blood has too much salt in it. You may need to stay in the hospital to be treated. Treatment may include drinking more water or getting fluids through an IV tube. Lab tests will be done to make sure treatment is working. Drink enough fluid to keep your pee pale yellow. This information is not intended to replace advice given to you by your health care provider. Make sure you discuss any questions you have with your health care provider. Document Revised: 10/17/2020 Document Reviewed: 10/17/2020 Elsevier Patient Education  [employer].

## 2021-09-22 ENCOUNTER — Encounter: Payer: Self-pay | Admitting: Family Medicine

## 2021-09-22 LAB — BASIC METABOLIC PANEL
BUN/Creatinine Ratio: 8 — ABNORMAL LOW (ref 12–28)
BUN: 8 mg/dL (ref 8–27)
CO2: 23 mmol/L (ref 20–29)
Calcium: 9.9 mg/dL (ref 8.7–10.3)
Chloride: 106 mmol/L (ref 96–106)
Creatinine, Ser: 1.02 mg/dL — ABNORMAL HIGH (ref 0.57–1.00)
Glucose: 100 mg/dL — ABNORMAL HIGH (ref 70–99)
Potassium: 4.6 mmol/L (ref 3.5–5.2)
Sodium: 143 mmol/L (ref 134–144)
eGFR: 57 mL/min/{1.73_m2} — ABNORMAL LOW (ref >59)

## 2021-09-24 ENCOUNTER — Telehealth: Payer: Self-pay | Admitting: Family Medicine

## 2021-09-24 NOTE — Telephone Encounter (Signed)
HIPAA compliant callback message left.  Advise her: Her sodium level is back to normal. Her kidney test/creatinine level is slightly elevated - likely due to poor hydration. Encourage hydration and f/u in the future.  I have requested that a copy of her result be mailed to her home address.

## 2021-10-08 ENCOUNTER — Encounter: Payer: Self-pay | Admitting: Internal Medicine

## 2021-10-15 ENCOUNTER — Encounter: Payer: Self-pay | Admitting: Internal Medicine

## 2021-10-25 ENCOUNTER — Ambulatory Visit
Admission: RE | Admit: 2021-10-25 | Discharge: 2021-10-25 | Disposition: A | Discharge status: Home / Self Care | Payer: Medicare Other | Source: Ambulatory Visit | Attending: Family Medicine | Admitting: Family Medicine

## 2021-10-25 DIAGNOSIS — Z1231 Encounter for screening mammogram for malignant neoplasm of breast: Secondary | ICD-10-CM

## 2021-11-09 ENCOUNTER — Ambulatory Visit (AMBULATORY_SURGERY_CENTER): Payer: Self-pay | Admitting: *Deleted

## 2021-11-09 VITALS — Ht 65.0 in | Wt 144.0 lb

## 2021-11-09 DIAGNOSIS — Z86010 Personal history of colonic polyps: Secondary | ICD-10-CM

## 2021-11-09 MED ORDER — NA SULFATE-K SULFATE-MG SULF 17.5-3.13-1.6 GM/177ML PO SOLN: 1.0000 | ORAL | 0 refills | Status: DC

## 2021-11-09 NOTE — Progress Notes (Signed)
Patient is here in-person for PV. Patient denies any allergies to eggs or soy. Patient denies any problems with anesthesia/sedation. Patient is not on any oxygen at home. Patient is not taking any diet/weight loss medications or blood thinners. Went over procedure prep instructions with the patient. 2 day prep given due to constipation. Pt declined zofran. Patient is aware of our care-partner policy. Patient notified to use Good-Rx card given to pt for prescription.

## 2021-11-14 ENCOUNTER — Ambulatory Visit: Payer: Medicare Other

## 2021-11-30 ENCOUNTER — Ambulatory Visit (AMBULATORY_SURGERY_CENTER): Payer: Medicare Other | Admitting: Internal Medicine

## 2021-11-30 ENCOUNTER — Encounter: Payer: Self-pay | Admitting: Internal Medicine

## 2021-11-30 VITALS — BP 122/59 | HR 45 | Temp 98.7°F | Resp 15 | Ht 65.0 in | Wt 144.0 lb

## 2021-11-30 DIAGNOSIS — Z86010 Personal history of colonic polyps: Secondary | ICD-10-CM

## 2021-11-30 DIAGNOSIS — D124 Benign neoplasm of descending colon: Secondary | ICD-10-CM

## 2021-11-30 DIAGNOSIS — D122 Benign neoplasm of ascending colon: Secondary | ICD-10-CM

## 2021-11-30 DIAGNOSIS — D125 Benign neoplasm of sigmoid colon: Secondary | ICD-10-CM | POA: Diagnosis not present

## 2021-11-30 DIAGNOSIS — D123 Benign neoplasm of transverse colon: Secondary | ICD-10-CM | POA: Diagnosis not present

## 2021-11-30 DIAGNOSIS — Z09 Encounter for follow-up examination after completed treatment for conditions other than malignant neoplasm: Secondary | ICD-10-CM

## 2021-11-30 MED ORDER — SODIUM CHLORIDE 0.9 % IV SOLN: 500.0000 mL | Freq: Once | INTRAVENOUS | Status: DC

## 2021-11-30 NOTE — Progress Notes (Signed)
Report to PACU, RN, vss, BBS= Clear.  

## 2021-11-30 NOTE — Patient Instructions (Signed)
Information on polyps, diverticulosis and hemorrhoids given to you today.  Await pathology results.  Resume previous diet and medications.   YOU HAD AN ENDOSCOPIC PROCEDURE TODAY AT [facility]:   Refer to the procedure report that was given to you for any specific questions about what was found during the examination.  If the procedure report does not answer your questions, please call your gastroenterologist to clarify.  If you requested that your care partner not be given the details of your procedure findings, then the procedure report has been included in a sealed envelope for you to review at your convenience later.  YOU SHOULD EXPECT: Some feelings of bloating in the abdomen. Passage of more gas than usual.  Walking can help get rid of the air that was put into your GI tract during the procedure and reduce the bloating. If you had a lower endoscopy (such as a colonoscopy or flexible sigmoidoscopy) you may notice spotting of blood in your stool or on the toilet paper. If you underwent a bowel prep for your procedure, you may not have a normal bowel movement for a few days.  Please Note:  You might notice some irritation and congestion in your nose or some drainage.  This is from the oxygen used during your procedure.  There is no need for concern and it should clear up in a day or so.  SYMPTOMS TO REPORT IMMEDIATELY:  Following lower endoscopy (colonoscopy or flexible sigmoidoscopy):  Excessive amounts of blood in the stool  Significant tenderness or worsening of abdominal pains  Swelling of the abdomen that is new, acute  Fever of 100F or higher   For urgent or emergent issues, a gastroenterologist can be reached at any hour by calling [phone]. Do not use MyChart messaging for urgent concerns.    DIET:  We do recommend a small meal at first, but then you may proceed to your regular diet.  Drink plenty of fluids but you should avoid alcoholic beverages for 24  hours.  ACTIVITY:  You should plan to take it easy for the rest of today and you should NOT DRIVE or use heavy machinery until tomorrow (because of the sedation medicines used during the test).    FOLLOW UP: Our staff will call the number listed on your records the next business day following your procedure.  We will call around 7:15- 8:00 am to check on you and address any questions or concerns that you may have regarding the information given to you following your procedure. If we do not reach you, we will leave a message.  If you develop any symptoms (ie: fever, flu-like symptoms, shortness of breath, cough etc.) before then, please call [phone].  If you test positive for Covid 19 in the 2 weeks post procedure, please call and report this information to [place].    If any biopsies were taken you will be contacted by phone or by letter within the next 1-3 weeks.  Please call us at [phone] if you have not heard about the biopsies in 3 weeks.    SIGNATURES/CONFIDENTIALITY: You and/or your care partner have signed paperwork which will be entered into your electronic medical record.  These signatures attest to the fact that that the information above on your After Visit Summary has been reviewed and is understood.  Full responsibility of the confidentiality of this discharge information lies with you and/or your care-partner.

## 2021-11-30 NOTE — Progress Notes (Signed)
GASTROENTEROLOGY PROCEDURE H&P NOTE   Primary Care Physician: Kinnie Feil, MD    Reason for Procedure:  History of adenomatous colon polyps and poor prep at last colonoscopy 1 year ago  Plan:    Surveillance colonoscopy  Patient is appropriate for endoscopic procedure(s) in the ambulatory ([facility]) setting.  The nature of the procedure, as well as the risks, benefits, and alternatives were carefully and thoroughly reviewed with the patient. Ample time for discussion and questions allowed. The patient understood, was satisfied, and agreed to proceed.     HPI: Sherry Wilkinson is a 76 y.o. female who presents for surveillance colonoscopy.  Medical history as below.  Tolerated the prep.  No recent chest pain or shortness of breath.  No abdominal pain today.  Past Medical History:  Diagnosis Date   Anemia    Appendicitis    Arthritis    all over - feet and knees especially    Cataract    Liver cyst    Osteopenia    Paresthesia 10/10/2017   Perineal abscess 12/16/2014    Past Surgical History:  Procedure Laterality Date   APPENDECTOMY  1970   COLONOSCOPY  2018   COLONOSCOPY WITH PROPOFOL  07/25/2020   Dr.Jilliam Bellmore   POLYPECTOMY     UTERINE FIBROID SURGERY     WRIST SURGERY      Prior to Admission medications   Medication Sig Start Date End Date Taking? Authorizing Provider  ferrous sulfate 325 (65 FE) MG EC tablet Take 325 mg by mouth every morning.    [provider]  vitamin B-12 (CYANOCOBALAMIN) 500 MCG tablet Take 500 mcg by mouth daily.    [provider]    Current Outpatient Medications  Medication Sig Dispense Refill   ferrous sulfate 325 (65 FE) MG EC tablet Take 325 mg by mouth every morning.     vitamin B-12 (CYANOCOBALAMIN) 500 MCG tablet Take 500 mcg by mouth daily.     Current Facility-Administered Medications  Medication Dose Route Frequency Provider Last Rate Last Admin   0.9 %  sodium chloride infusion  500 mL Intravenous Once  Husain Costabile, Lajuan Lines, MD        Allergies as of 11/30/2021   (No Known Allergies)    Family History  Problem Relation Age of Onset   Heart disease Mother    Heart disease Maternal Grandmother    Colon cancer Neg Hx    Esophageal cancer Neg Hx    Rectal cancer Neg Hx    Stomach cancer Neg Hx    Colon polyps Neg Hx     Social History   Socioeconomic History   Marital status: Divorced    Spouse name: Not on file   Number of children: Not on file   Years of education: Not on file   Highest education level: Not on file  Occupational History   Not on file  Tobacco Use   Smoking status: Former    Types: Cigarettes    Quit date: 08/21/1998    Years since quitting: 23.2   Smokeless tobacco: Never  Vaping Use   Vaping Use: Never used  Substance and Sexual Activity   Alcohol use: No   Drug use: No   Sexual activity: Never  Other Topics Concern   Not on file  Social History Narrative   Not on file   Social Determinants of Health   Financial Resource Strain: Not on file  Food Insecurity: Not on file  Transportation Needs:  Not on file  Physical Activity: Not on file  Stress: Not on file  Social Connections: Not on file  Intimate Partner Violence: Not on file    Physical Exam: Vital signs in last 24 hours: '@BP'$  (!) 142/72   Pulse (!) 55   Temp 98.7 F (37.1 C)   Ht '5\' 5"'$  (1.651 m)   Wt 144 lb (65.3 kg)   SpO2 98%   BMI 23.96 kg/m  GEN: NAD EYE: Sclerae anicteric ENT: MMM CV: Non-tachycardic Pulm: CTA b/l GI: Soft, NT/ND NEURO:  Alert & Oriented x 3   Zenovia Jarred, MD [facility]  11/30/2021 10:42 AM

## 2021-11-30 NOTE — Progress Notes (Signed)
Called to room to assist during endoscopic procedure.  Patient ID and intended procedure confirmed with present staff. Received instructions for my participation in the procedure from the performing physician.  

## 2021-11-30 NOTE — Progress Notes (Signed)
Vital signs checked by:AS  The patient states no changes in medical or surgical history since pre-visit screening on 11/09/21

## 2021-11-30 NOTE — Op Note (Signed)
[facility] Patient Name: Sherry Wilkinson Procedure Date: 11/30/2021 10:49 AM MRN: [id number] Endoscopist: Jerene Bears , MD Age: 76 Referring MD:  Date of Birth: 23-May-1945 Gender: Female Account #: [id number] Procedure:                Colonoscopy Indications:              High risk colon cancer surveillance: Personal                            history of multiple adenomas, poor prep at                            colonoscopy in 2022 Medicines:                Monitored Anesthesia Care Procedure:                Pre-Anesthesia Assessment:                           - Prior to the procedure, a History and Physical                            was performed, and patient medications and                            allergies were reviewed. The patient's tolerance of                            previous anesthesia was also reviewed. The risks                            and benefits of the procedure and the sedation                            options and risks were discussed with the patient.                            All questions were answered, and informed consent                            was obtained. Prior Anticoagulants: The patient has                            taken no previous anticoagulant or antiplatelet                            agents. ASA Grade Assessment: II - A patient with                            mild systemic disease. After reviewing the risks                            and benefits, the patient was deemed in  satisfactory condition to undergo the procedure.                           After obtaining informed consent, the colonoscope                            was passed under direct vision. Throughout the                            procedure, the patient's blood pressure, pulse, and                            oxygen saturations were monitored continuously. The                            PCF-HQ190L Colonoscope was introduced through  the                            anus and advanced to the cecum, identified by                            appendiceal orifice and ileocecal valve. The                            colonoscopy was performed without difficulty. The                            patient tolerated the procedure well. The quality                            of the bowel preparation was good (with 2 day                            Suprep and MiraLax). The ileocecal valve,                            appendiceal orifice, and rectum were photographed. Scope In: 10:51:38 AM Scope Out: 26:94:85 AM Scope Withdrawal Time: 0 hours 21 minutes 2 seconds  Total Procedure Duration: 0 hours 24 minutes 39 seconds  Findings:                 The digital rectal exam was normal.                           Two sessile polyps were found in the ascending                            colon. The polyps were 3 to 10 mm in size. These                            polyps were removed with a cold snare. Resection                            and retrieval were complete.  Two sessile polyps were found in the transverse                            colon. The polyps were 4 to 5 mm in size. These                            polyps were removed with a cold snare. Resection                            and retrieval were complete.                           A 5 mm polyp was found in the descending colon. The                            polyp was sessile. The polyp was removed with a                            cold snare. Resection and retrieval were complete.                           Two sessile polyps were found in the sigmoid colon.                            The polyps were 3 to 4 mm in size. These polyps                            were removed with a cold snare. Resection and                            retrieval were complete.                           A few small-mouthed diverticula were found in the                             sigmoid colon.                           Internal hemorrhoids were found during                            retroflexion. The hemorrhoids were medium-sized. Complications:            No immediate complications. Estimated Blood Loss:     Estimated blood loss was minimal. Impression:               - Two 3 to 10 mm polyps in the ascending colon,                            removed with a cold snare. Resected and retrieved.                           - Two  4 to 5 mm polyps in the transverse colon,                            removed with a cold snare. Resected and retrieved.                           - One 5 mm polyp in the descending colon, removed                            with a cold snare. Resected and retrieved.                           - Two 3 to 4 mm polyps in the sigmoid colon,                            removed with a cold snare. Resected and retrieved.                           - Mild diverticulosis in the sigmoid colon.                           - Internal hemorrhoids. Recommendation:           - Patient has a contact number available for                            emergencies. The signs and symptoms of potential                            delayed complications were discussed with the                            patient. Return to normal activities tomorrow.                            Written discharge instructions were provided to the                            patient.                           - Resume previous diet.                           - Continue present medications.                           - Await pathology results.                           - Repeat colonoscopy may be recommended for                            surveillance. The colonoscopy date will be  determined after pathology results from today's                            exam become available for review. Jerene Bears, MD 11/30/2021 11:23:12 AM This report has been signed electronically.

## 2021-12-03 ENCOUNTER — Telehealth: Payer: Self-pay

## 2021-12-03 NOTE — Telephone Encounter (Signed)
Patient returned call stating that she was doing well.  

## 2021-12-03 NOTE — Telephone Encounter (Signed)
  Follow up Call-     11/30/2021   10:09 AM 07/25/2020   10:04 AM  Call back number  Post procedure Call Back phone  # [phone]  Permission to leave phone message Yes Yes     Post op call attempted, no answer, left WM.

## 2021-12-04 ENCOUNTER — Ambulatory Visit
Admission: RE | Admit: 2021-12-04 | Discharge: 2021-12-04 | Disposition: A | Discharge status: Home / Self Care | Payer: Medicare Other | Source: Ambulatory Visit | Attending: Family Medicine | Admitting: Family Medicine

## 2021-12-04 ENCOUNTER — Encounter: Payer: Self-pay | Admitting: Internal Medicine

## 2021-12-04 ENCOUNTER — Ambulatory Visit: Payer: Medicare Other

## 2021-12-04 DIAGNOSIS — Z1231 Encounter for screening mammogram for malignant neoplasm of breast: Secondary | ICD-10-CM

## 2021-12-31 ENCOUNTER — Ambulatory Visit
Admission: RE | Admit: 2021-12-31 | Discharge: 2021-12-31 | Disposition: A | Discharge status: Home / Self Care | Payer: Medicare Other | Source: Ambulatory Visit | Attending: Family Medicine | Admitting: Family Medicine

## 2021-12-31 DIAGNOSIS — E2839 Other primary ovarian failure: Secondary | ICD-10-CM

## 2021-12-31 DIAGNOSIS — M85852 Other specified disorders of bone density and structure, left thigh: Secondary | ICD-10-CM | POA: Diagnosis not present

## 2021-12-31 DIAGNOSIS — Z780 Asymptomatic menopausal state: Secondary | ICD-10-CM | POA: Diagnosis not present

## 2021-12-31 DIAGNOSIS — M810 Age-related osteoporosis without current pathological fracture: Secondary | ICD-10-CM | POA: Diagnosis not present

## 2021-12-31 NOTE — Progress Notes (Signed)
Tried contacting the pt twice to schedule follow up appt, however pt did not answer.

## 2022-01-14 ENCOUNTER — Telehealth: Payer: Self-pay | Admitting: Family Medicine

## 2022-01-14 NOTE — Telephone Encounter (Signed)
HIPAA compliant callback message.   Please advise her that her Dexa scan shows that she still has bone loss disease and may benefit from treatment. Help her schedule follow up with me to discuss further.   Dr. Johnette Abraham

## 2022-01-15 NOTE — Telephone Encounter (Signed)
Patient returns call to nurse line.   Patient scheduled for 10/3 with PCP.

## 2022-01-22 ENCOUNTER — Ambulatory Visit (HOSPITAL_BASED_OUTPATIENT_CLINIC_OR_DEPARTMENT_OTHER): Payer: Medicare Other | Admitting: Family Medicine

## 2022-01-22 ENCOUNTER — Encounter: Payer: Self-pay | Admitting: Family Medicine

## 2022-01-22 VITALS — BP 113/62 | HR 69 | Ht 65.0 in | Wt 140.4 lb

## 2022-01-22 DIAGNOSIS — N289 Disorder of kidney and ureter, unspecified: Secondary | ICD-10-CM | POA: Diagnosis not present

## 2022-01-22 DIAGNOSIS — R7303 Prediabetes: Secondary | ICD-10-CM | POA: Diagnosis not present

## 2022-01-22 DIAGNOSIS — M810 Age-related osteoporosis without current pathological fracture: Secondary | ICD-10-CM | POA: Diagnosis not present

## 2022-01-22 DIAGNOSIS — R202 Paresthesia of skin: Secondary | ICD-10-CM

## 2022-01-22 DIAGNOSIS — R7309 Other abnormal glucose: Secondary | ICD-10-CM

## 2022-01-22 NOTE — Progress Notes (Signed)
    SUBJECTIVE:   CHIEF COMPLAINT / HPI:   Osteoporosis: She is here to discuss her result. She is compliant with a Vit D supplement but d/ced calcium supplement due to leg pain.  Toe numbness/PreDM: C/O left big toe numbness and burning pain ongoing for months and worsening. She feels this might be due to her prediabetes and wishes to check for DM. No recent trauma.  PERTINENT  PMH / PSH: PMHx reviewed  OBJECTIVE:   BP 113/62   Pulse 69   Ht '5\' 5"'$  (1.651 m)   Wt 140 lb 6.4 oz (63.7 kg)   SpO2 100%   BMI 23.36 kg/m   Physical Exam Vitals reviewed.  Cardiovascular:     Rate and Rhythm: Normal rate and regular rhythm.     Heart sounds: Normal heart sounds. No murmur heard. Pulmonary:     Effort: Pulmonary effort is normal. No respiratory distress.     Breath sounds: Normal breath sounds. No stridor. No wheezing.  Abdominal:     General: Abdomen is flat. Bowel sounds are normal.  Musculoskeletal:     Comments: Sensory exam of the foot is normal, tested with the monofilament. Reduced left great toe sensation. Good pulses, no lesions or ulcers, good peripheral pulses.       ASSESSMENT/PLAN:   Osteoporosis I reviewed and compared her dexa scan from 2019 to her recent and discussed result with her. Biphosphonate discussed including the injectables. She declined and prefer to continue Vit D without calcium. Monitor for now on Vit D supplements.  Paresthesia Recent Vit B12 and TSH were normal Nerve testing discussed. Patient referred to neuro.  Prediabetes A1C checked today.   Mild renal impairment from recent lab. Repeat lab today.  Declined flu and covid shots  Sherry Mews, MD [facility]

## 2022-01-22 NOTE — Patient Instructions (Signed)
Osteoporosis  Osteoporosis is when the bones get thin and weak. This can cause your bones to break (fracture) more easily. What are the causes? The exact cause of this condition is not known. What increases the risk? Having family members with this condition. Not eating enough healthy foods. Taking certain medicines. Being female. Being age 77 or older. Smoking or using other products that contain nicotine or tobacco, such as e-cigarettes or chewing tobacco. Not exercising. Being of European or Asian ancestry. Having a small body frame. What are the signs or symptoms? A broken bone might be the first sign, especially if the break results from a fall or injury that usually would not cause a bone to break. Other signs and symptoms include: Pain in the neck or low back. Being hunched over (stooped posture). Getting shorter. How is this treated? Eating more foods with more calcium and vitamin D in them. Doing exercises. Stopping tobacco use. Limiting how much alcohol you drink. Taking medicines to slow bone loss or help make the bones stronger. Taking supplements of calcium and vitamin D every day. Taking medicines to replace chemicals in the body (hormone replacement medicines). Monitoring your levels of calcium and vitamin D. The goal of treatment is to strengthen your bones and lower your risk for a bone break. Follow these instructions at home: Eating and drinking Eat plenty of calcium and vitamin D. These nutrients are good for your bones. Good sources of calcium and vitamin D include: Some fish, such as salmon and tuna. Foods that have calcium and vitamin D added to them (fortified foods), such as some breakfast cereals. Egg yolks. Cheese. Liver.  Activity Do exercises as told by your doctor. Ask your doctor what exercises are safe for you. You should do: Exercises that make your muscles work to hold your body weight up (weight-bearing exercises). These include tai chi,  yoga, and walking. Exercises to make your muscles stronger. One example is lifting weights. Lifestyle Do not drink alcohol if: Your doctor tells you not to drink. You are pregnant, may be pregnant, or are planning to become pregnant. If you drink alcohol: Limit how much you use to: 0-1 drink a day for women. 0-2 drinks a day for men. Know how much alcohol is in your drink. In the [place] one drink equals one 12 oz bottle of beer (355 mL), one 5 oz glass of wine (148 mL), or one 1 oz glass of hard liquor (44 mL). Do not smoke or use any products that contain nicotine or tobacco. If you need help quitting, ask your doctor. Preventing falls Use tools to help you move around (mobility aids) as needed. These include canes, walkers, scooters, and crutches. Keep rooms well-lit. Put away things on the floor that could make you trip. These include cords and rugs. Install safety rails on stairs. Install grab bars in bathrooms. Use rubber mats in slippery areas, like bathrooms. Wear shoes that: Fit you well. Support your feet. Have closed toes. Have rubber soles or low heels. Tell your doctor about all of the medicines you are taking. Some medicines can make you more likely to fall. General instructions Take over-the-counter and prescription medicines only as told by your doctor. Keep all follow-up visits. Contact a doctor if: You have not been tested (screened) for osteoporosis and you are: A woman who is age 74 or older. A man who is age 35 or older. Get help right away if: You fall. You get hurt. Summary Osteoporosis happens when your  bones get thin and weak. Weak bones can break (fracture) more easily. Eat plenty of calcium and vitamin D. These are good for your bones. Tell your doctor about all of the medicines that you take. This information is not intended to replace advice given to you by your health care provider. Make sure you discuss any questions you have with your health care  provider. Document Revised: 09/23/2019 Document Reviewed: 09/23/2019 Elsevier Patient Education  2023 [employer] Inc.  

## 2022-01-22 NOTE — Assessment & Plan Note (Signed)
I reviewed and compared her dexa scan from 2019 to her recent and discussed result with her. Biphosphonate discussed including the injectables. She declined and prefer to continue Vit D without calcium. Monitor for now on Vit D supplements.

## 2022-01-22 NOTE — Assessment & Plan Note (Signed)
Recent Vit B12 and TSH were normal Nerve testing discussed. Patient referred to neuro.

## 2022-01-22 NOTE — Assessment & Plan Note (Signed)
A1C checked today.

## 2022-01-23 LAB — BASIC METABOLIC PANEL WITH GFR
BUN/Creatinine Ratio: 9 — ABNORMAL LOW (ref 12–28)
BUN: 9 mg/dL (ref 8–27)
CO2: 23 mmol/L (ref 20–29)
Calcium: 9.6 mg/dL (ref 8.7–10.3)
Chloride: 104 mmol/L (ref 96–106)
Creatinine, Ser: 1 mg/dL (ref 0.57–1.00)
Glucose: 87 mg/dL (ref 70–99)
Potassium: 4 mmol/L (ref 3.5–5.2)
Sodium: 143 mmol/L (ref 134–144)
eGFR: 58 mL/min/{1.73_m2} — ABNORMAL LOW

## 2022-01-23 LAB — VITAMIN D 25 HYDROXY (VIT D DEFICIENCY, FRACTURES): Vit D, 25-Hydroxy: 37.1 ng/mL (ref 30.0–100.0)

## 2022-01-23 LAB — HEMOGLOBIN A1C
Est. average glucose Bld gHb Est-mCnc: 123 mg/dL
Hgb A1c MFr Bld: 5.9 % — ABNORMAL HIGH (ref 4.8–5.6)

## 2022-01-24 ENCOUNTER — Encounter: Payer: Self-pay | Admitting: Neurology

## 2022-01-24 ENCOUNTER — Telehealth: Payer: Self-pay | Admitting: Family Medicine

## 2022-01-24 NOTE — Telephone Encounter (Signed)
HIPAA compliant callback message left.  Please advise:  A1C shows that she is pre diabetic. Vitamin D level is low normal. Continue supplementation. Kidney function improved.  Please, mail result copy to her home.

## 2022-01-25 ENCOUNTER — Encounter: Payer: Self-pay | Admitting: Family Medicine

## 2022-02-04 IMAGING — MG MM DIGITAL SCREENING BILAT W/ TOMO AND CAD
8 series · 8 of 24 positions shown · non-contrast
Comparison: Previous exam(s).

CLINICAL DATA: Screening.

EXAM:
DIGITAL SCREENING BILATERAL MAMMOGRAM WITH TOMOSYNTHESIS AND CAD
TECHNIQUE: Bilateral screening digital craniocaudal and mediolateral oblique
mammograms were obtained. Bilateral screening digital breast
tomosynthesis was performed. The images were evaluated with
computer-aided detection.

[L CC synth-2D]
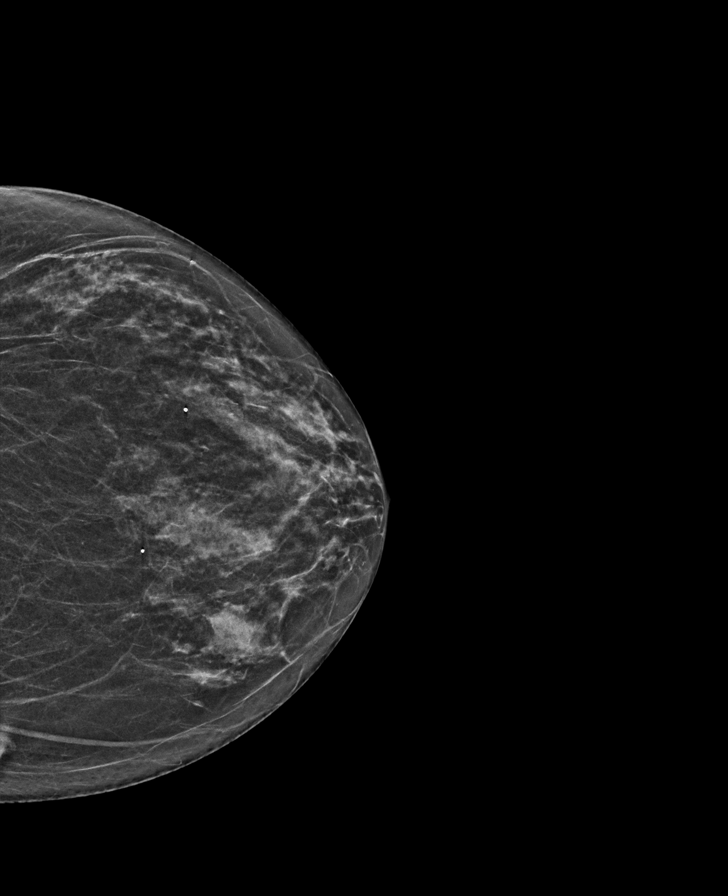

[R CC synth-2D]
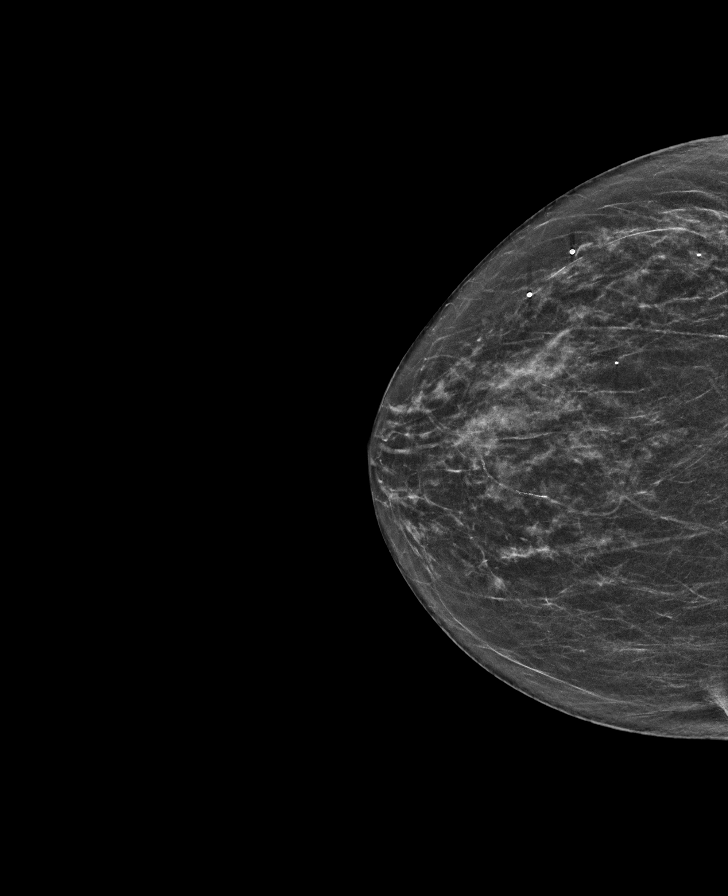

[R MLO synth-2D]
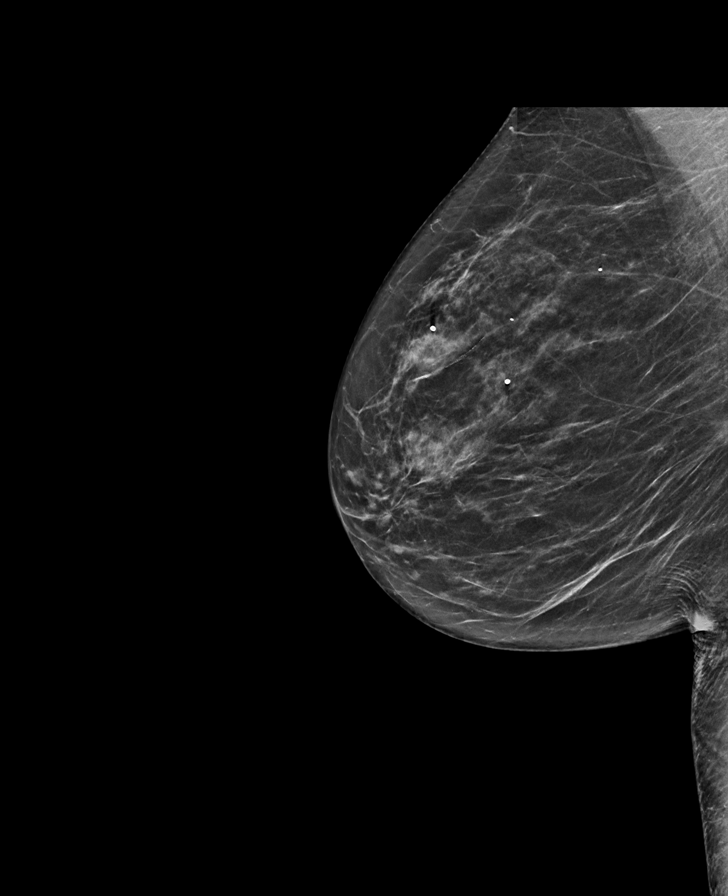

[L MLO synth-2D]
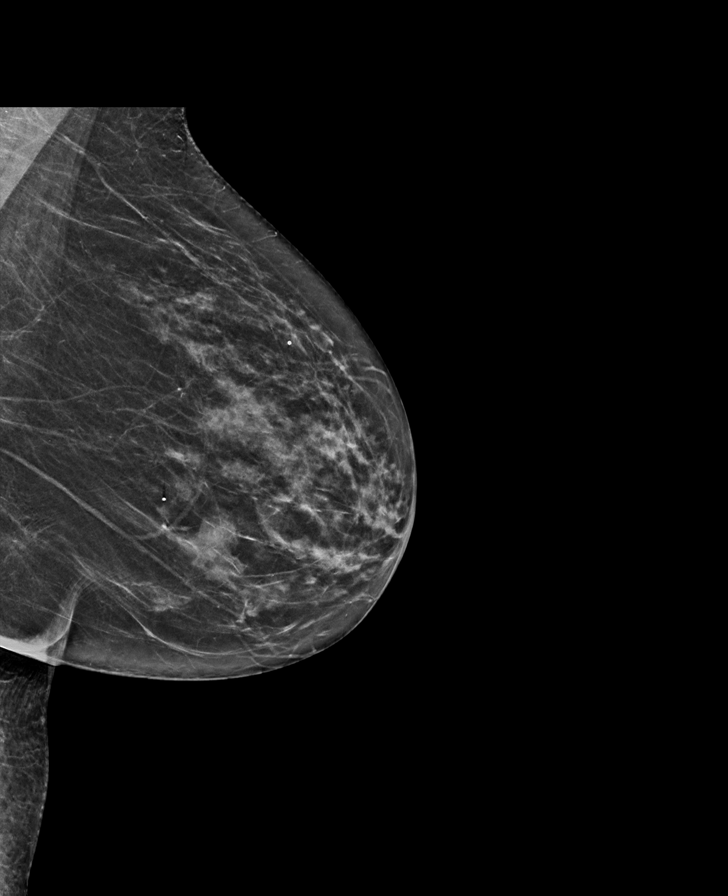

[L CC tomo · tomo slice 27/52.0]
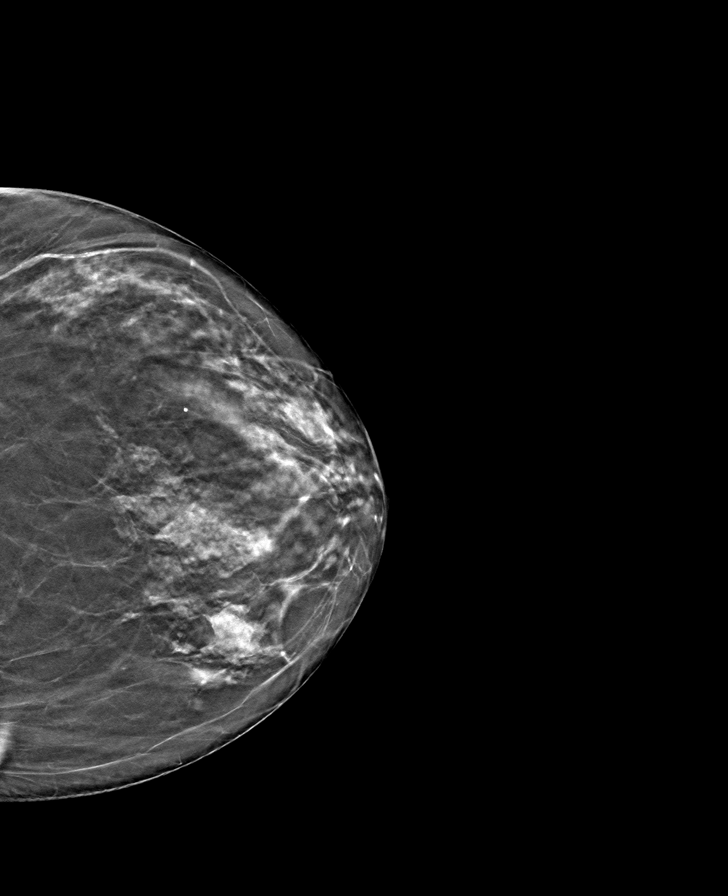

[R CC tomo · tomo slice 27/54.0]
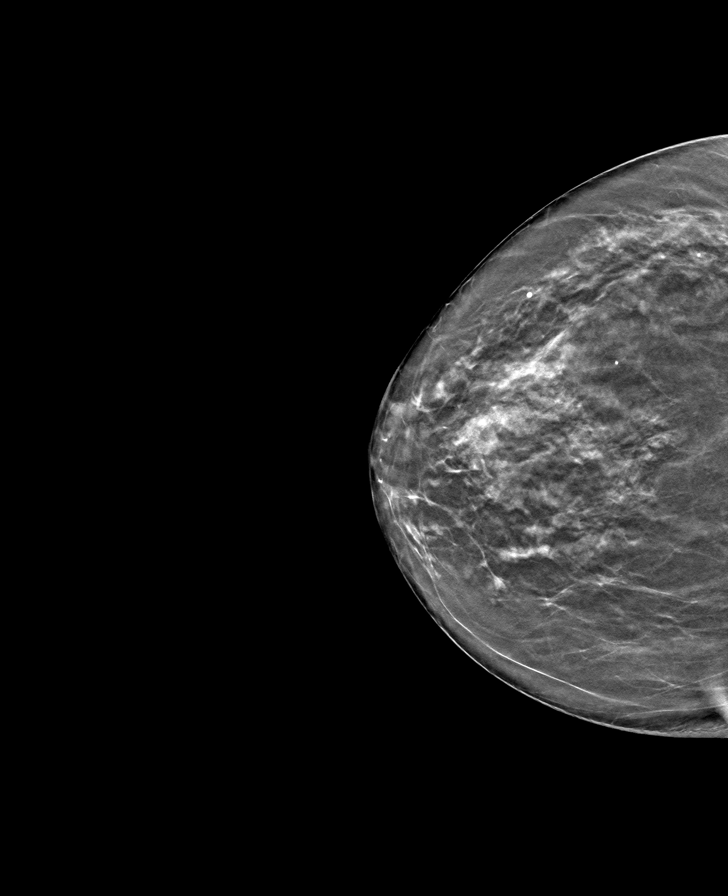

[L MLO tomo · tomo slice 30/59.0]
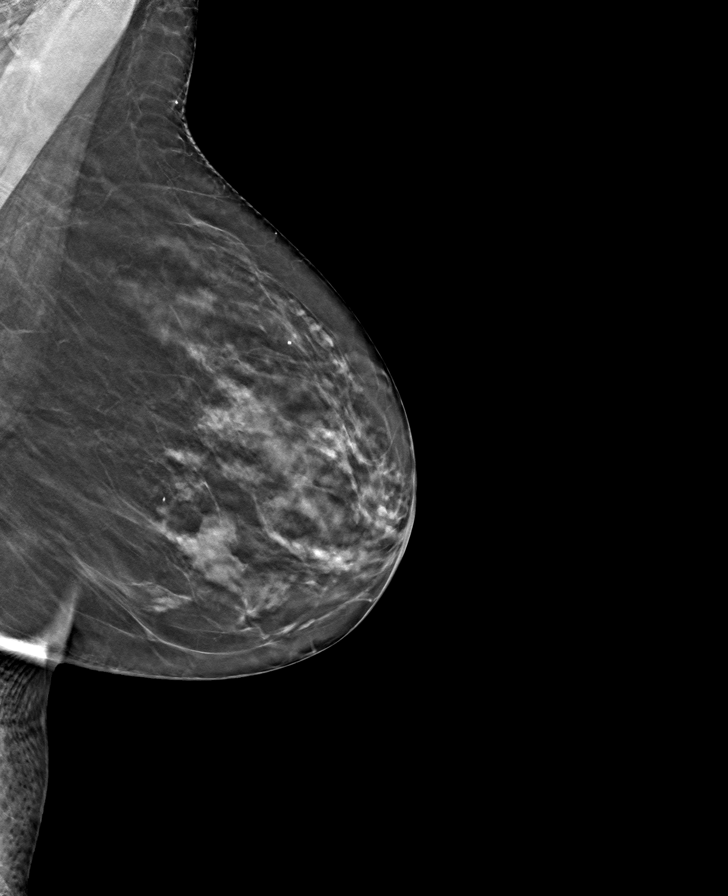

[R MLO tomo · tomo slice 30/59.0]
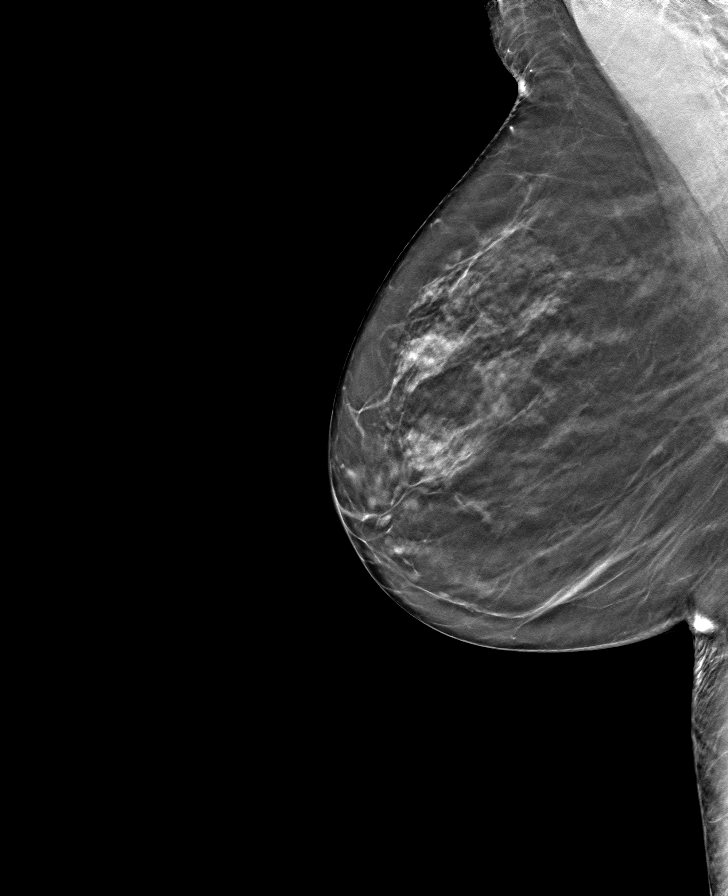

[8 of 24 positions shown; findings below may reference images not displayed]

ACR Breast Density Category b: There are scattered areas of
fibroglandular density.
FINDINGS: There are no findings suspicious for malignancy.
IMPRESSION: No mammographic evidence of malignancy. A result letter of this
screening mammogram will be mailed directly to the patient.

RECOMMENDATION:
Screening mammogram in one year. (Code:51-O-LD2)

BI-RADS CATEGORY  1: Negative.

## 2022-03-26 NOTE — Progress Notes (Deleted)
Initial neurology clinic note  SERVICE DATE: 03/29/22  Reason for Evaluation: Consultation requested by Kinnie Feil, MD for an opinion regarding left foot numbness and burning pain. My final recommendations will be communicated back to the requesting physician by way of shared medical record or letter to requesting physician via [place] mail.  HPI: This is [name], a 76 y.o. ***-handed female with a medical history of osteoporosis, pre-DM, HLD, vit B12 deficiency*** who presents to neurology clinic with the chief complaint of left foot numbness and burning pain. The patient is accompanied by ***.  *** Left foot numbness and burning pain - Present for months, worsening  Takes B12 500 mcg daily, vit D, and ferrous sulfate  The patient has not*** had similar episodes of symptoms in the past. ***  Muscle bulk loss? *** Muscle pain? ***  Cramps/Twitching? *** Suggestion of myotonia/difficulty relaxing after contraction? ***  Fatigable weakness?*** Does strength improve after brief exercise?***  Able to brush hair/teeth without difficulty? *** Able to button shirts/use zips? *** Clumsiness/dropping grasped objects?*** Can you arise from squatted position easily? *** Able to get out of chair without using arms? *** Able to walk up steps easily? *** Use an assistive device to walk? *** Significant imbalance with walking? *** Falls?*** Any change in urine color, especially after exertion/physical activity? ***  The patient denies*** symptoms suggestive of oculobulbar weakness including diplopia, ptosis, dysphagia, poor saliva control, dysarthria/dysphonia, impaired mastication, facial weakness/droop.  There are no*** neuromuscular respiratory weakness symptoms, particularly orthopnea>dyspnea.   Pseudobulbar affect is absent***.  The patient does not*** report symptoms referable to autonomic dysfunction including impaired sweating, heat or cold intolerance, excessive mucosal  dryness, gastroparetic early satiety, postprandial abdominal bloating, constipation, bowel or bladder dyscontrol, erectile dysfunction*** or syncope/presyncope/orthostatic intolerance.  There are no*** complaints relating to other symptoms of small fiber modalities including paresthesia/pain.  The patient has not *** noticed any recent skin rashes nor does he*** report any constitutional symptoms like fever, night sweats, anorexia or unintentional weight loss.  EtOH use: ***  Restrictive diet? *** Family history of neuropathy/myopathy/NM disease?***  Previous labs, electrodiagnostics, and neuroimaging are summarized below, but pertinent findings include***  Any biopsy done? *** Current medications being tried for the patient's symptoms include ***  Prior medications that have been tried: ***   MEDICATIONS:  Outpatient Encounter Medications as of 03/29/2022  Medication Sig   Cholecalciferol (VITAMIN D3) 75 MCG (3000 UT) TABS Take 1 tablet by mouth.   ferrous sulfate 325 (65 FE) MG EC tablet Take 325 mg by mouth every morning.   vitamin B-12 (CYANOCOBALAMIN) 500 MCG tablet Take 500 mcg by mouth daily.   No facility-administered encounter medications on file as of 03/29/2022.    PAST MEDICAL HISTORY: Past Medical History:  Diagnosis Date   Anemia    Appendicitis    Arthritis    all over - feet and knees especially    Cataract    Liver cyst    Osteopenia    Paresthesia 10/10/2017   Perineal abscess 12/16/2014    PAST SURGICAL HISTORY: Past Surgical History:  Procedure Laterality Date   APPENDECTOMY  1970   COLONOSCOPY  2018   COLONOSCOPY WITH PROPOFOL  07/25/2020   Dr.Pyrtle   POLYPECTOMY     UTERINE FIBROID SURGERY     WRIST SURGERY      ALLERGIES: No Known Allergies  FAMILY HISTORY: Family History  Problem Relation Age of Onset   Heart disease Mother  Heart disease Maternal Grandmother    Colon cancer Neg Hx    Esophageal cancer Neg Hx    Rectal cancer Neg Hx     Stomach cancer Neg Hx    Colon polyps Neg Hx     SOCIAL HISTORY: Social History   Tobacco Use   Smoking status: Former    Types: Cigarettes    Quit date: 08/21/1998    Years since quitting: 23.6   Smokeless tobacco: Never  Vaping Use   Vaping Use: Never used  Substance Use Topics   Alcohol use: No   Drug use: No   Social History   Social History Narrative   Not on file     OBJECTIVE: PHYSICAL EXAM: There were no vitals taken for this visit.  General:*** General appearance: Awake and alert. No distress. Cooperative with exam.  Skin: No obvious rash or jaundice. HEENT: Atraumatic. Anicteric. Lungs: Non-labored breathing on room air  Heart: Regular Abdomen: Soft, non tender. Extremities: No edema. No obvious deformity.  Musculoskeletal: No obvious joint swelling. Psych: Affect appropriate.  Neurological: Mental Status: Alert. Speech fluent. No pseudobulbar affect Cranial Nerves: CNII: No RAPD. Visual fields grossly intact. CNIII, IV, VI: PERRL. No nystagmus. EOMI. CN V: Facial sensation intact bilaterally to fine touch. [place] clench strong. Jaw jerk***. CN VII: Facial muscles symmetric and strong. No ptosis at rest or after sustained upgaze***. CN VIII: Hearing grossly intact bilaterally. CN IX: No hypophonia. CN X: Palate elevates symmetrically. CN XI: Full strength shoulder shrug bilaterally. CN XII: Tongue protrusion full and midline. No atrophy or fasciculations. No significant dysarthria*** Motor: Tone is ***. *** fasciculations in *** extremities. *** atrophy. No grip or percussive myotonia.***  Individual muscle group testing (MRC grade out of 5):  Movement     Neck flexion ***    Neck extension ***     Right Left   Shoulder abduction *** ***   Shoulder adduction *** ***   Shoulder ext rotation *** ***   Shoulder int rotation *** ***   Elbow flexion *** ***   Elbow extension *** ***   Wrist extension *** ***   Wrist flexion *** ***    Finger abduction - FDI *** ***   Finger abduction - ADM *** ***   Finger extension *** ***   Finger distal flexion - 2/3 *** ***   Finger distal flexion - 4/5 *** ***   Thumb flexion - FPL *** ***   Thumb abduction - APB *** ***    Hip flexion *** ***   Hip extension *** ***   Hip adduction *** ***   Hip abduction *** ***   Knee extension *** ***   Knee flexion *** ***   Dorsiflexion *** ***   Plantarflexion *** ***   Inversion *** ***   Eversion *** ***   Great toe extension *** ***   Great toe flexion *** ***     Reflexes:  Right Left   Bicep *** ***   Tricep *** ***   BrRad *** ***   Knee *** ***   Ankle *** ***    Pathological Reflexes: Babinski: *** response bilaterally*** Hoffman: *** Troemner: *** Pectoral: *** Palmomental: *** Facial: *** Midline tap: *** Sensation: Pinprick: *** Vibration: *** Temperature: *** Proprioception: *** Coordination: Intact finger-to- nose-finger bilaterally. Romberg negative.*** Gait: Able to rise from chair with arms crossed unassisted. Normal, narrow-based gait. Able to tandem walk. Able to walk on toes and heels.***  Lab and Test Review: Internal labs:  Normal or unremarkable: vit D, BMP A1c (01/22/22): 5.9 TSH (01/2021): 3.540 B12 (10/10/2017): < 150 ***  External labs: ***  ***  ASSESSMENT: Sherry Wilkinson is a 76 y.o. female who presents for evaluation of ***. *** has a relevant medical history of ***. *** neurological examination is pertinent for ***. Available diagnostic data is significant for ***. This constellation of symptoms and objective data would most likely localize to ***. ***  PLAN: -Blood work: *** ***  -Return to clinic ***  The impression above as well as the plan as outlined below were extensively discussed with the patient (in the company of ***) who voiced understanding. All questions were answered to their satisfaction.  The patient was counseled on pertinent fall precautions per the  printed material provided today, and as noted under the "Patient Instructions" section below.***  When available, results of the above investigations and possible further recommendations will be communicated to the patient via telephone/MyChart. Patient to call office if not contacted after expected testing turnaround time.   Total time spent reviewing records, interview, history/exam, documentation, and coordination of care on day of encounter:  *** min   Thank you for allowing me to participate in patient's care.  If I can answer any additional questions, I would be pleased to do so.  Kai Levins, MD   CC: Kinnie Feil, MD [place] [id number]  CC: Referring provider: Kinnie Feil, MD [address] [place],  [place] [id number]

## 2022-03-29 ENCOUNTER — Ambulatory Visit: Payer: Medicare Other | Admitting: Neurology

## 2022-03-29 ENCOUNTER — Encounter: Payer: Self-pay | Admitting: Neurology

## 2022-05-07 ENCOUNTER — Encounter: Payer: Self-pay | Admitting: Family Medicine

## 2022-05-07 NOTE — Progress Notes (Signed)
Initial neurology clinic note  SERVICE DATE: 05/15/21  Reason for Evaluation: Consultation requested by Kinnie Feil, MD for an opinion regarding pain in feet. My final recommendations will be communicated back to the requesting physician by way of shared medical record or letter to requesting physician via [place] mail.  HPI: This is [name], a 77 y.o. right-handed female with a medical history of pre-diabetes, osteoporosis, B12 deficiency who presents to neurology clinic with the chief complaint of left big toe numbness and burning pain. The patient is alone today.  Patient has been having problem in her feet for about 1 year. She has pain in both feet described as an electric type, tingling, or burning pain. It comes and goes. She has some numbness in her left foot. She has seen podiatry in the past who told her her pulses were fine. She denies weakness, imbalance, or falls.  Patient takes B12 for about 1 year, but is unsure how much she is on. This has helped a little with the pain. She will soak her feet and this also can help.  She does not report any constitutional symptoms like fever, night sweats, anorexia or unintentional weight loss.  EtOH use: None  Restrictive diet? Vegetarian Family history of neuropathy/myopathy/NM disease? no  She has never had an EMG and never been given medications for her symptoms. She does not think her symptoms are bad enough to warrant medication.   MEDICATIONS:  Outpatient Encounter Medications as of 05/15/2022  Medication Sig   Cholecalciferol (VITAMIN D3) 75 MCG (3000 UT) TABS Take 1 tablet by mouth.   ferrous sulfate 325 (65 FE) MG EC tablet Take 325 mg by mouth every morning.   vitamin B-12 (CYANOCOBALAMIN) 500 MCG tablet Take 500 mcg by mouth daily.   vitamin C (ASCORBIC ACID) 250 MG tablet Take 250 mg by mouth daily.   No facility-administered encounter medications on file as of 05/15/2022.    PAST MEDICAL HISTORY: Past  Medical History:  Diagnosis Date   Anemia    Appendicitis    Arthritis    all over - feet and knees especially    Cataract    Liver cyst    Osteopenia    Paresthesia 10/10/2017   Perineal abscess 12/16/2014    PAST SURGICAL HISTORY: Past Surgical History:  Procedure Laterality Date   APPENDECTOMY  1970   COLONOSCOPY  2018   COLONOSCOPY WITH PROPOFOL  07/25/2020   Dr.Pyrtle   POLYPECTOMY     UTERINE FIBROID SURGERY     WRIST SURGERY      ALLERGIES: No Known Allergies  FAMILY HISTORY: Family History  Problem Relation Age of Onset   Heart disease Mother    Heart disease Maternal Grandmother    Colon cancer Neg Hx    Esophageal cancer Neg Hx    Rectal cancer Neg Hx    Stomach cancer Neg Hx    Colon polyps Neg Hx     SOCIAL HISTORY: Social History   Tobacco Use   Smoking status: Former    Types: Cigarettes    Quit date: 08/21/1998    Years since quitting: 23.7   Smokeless tobacco: Never  Vaping Use   Vaping Use: Never used  Substance Use Topics   Alcohol use: No   Drug use: No   Social History   Social History Narrative   Are you right handed or left handed? right   Are you currently employed ?    What is  your current occupation?retire   Do you live at home alone? yes   Who lives with you?    What type of home do you live in: 1 story or 2 story? one    No caffeine     OBJECTIVE: PHYSICAL EXAM: BP 122/74   Pulse 67   Wt 140 lb 6.4 oz (63.7 kg)   SpO2 99%   BMI 23.36 kg/m   General: General appearance: Awake and alert. No distress. Cooperative with exam.  Skin: No obvious rash or jaundice. HEENT: Atraumatic. Anicteric. Lungs: Non-labored breathing on room air  Psych: flat affect  Neurological: Mental Status: Alert. Speech fluent. No pseudobulbar affect Cranial Nerves: CNII: No RAPD. Visual fields grossly intact. CNIII, IV, VI: PERRL. No nystagmus. EOMI. CN V: Facial sensation intact bilaterally to fine touch. CN VII: Facial muscles  symmetric and strong. No ptosis at rest. CN VIII: Hearing grossly intact bilaterally. CN IX: No hypophonia. CN X: Palate elevates symmetrically. CN XI: Full strength shoulder shrug bilaterally. CN XII: Tongue protrusion full and midline. No atrophy or fasciculations. No significant dysarthria Motor: Tone is normal.  Individual muscle group testing (MRC grade out of 5):  Movement     Neck flexion 5    Neck extension 5     Right Left   Shoulder abduction 5 5   Elbow flexion 5 5   Elbow extension 5 5   Finger abduction - FDI 5 5   Finger abduction - ADM 5 5   Finger extension 5 5   Finger distal flexion - 2/'3 5 5   '$ Finger distal flexion - 4/'5 5 5   '$ Thumb flexion - FPL 5 5   Thumb abduction - APB 5 5    Hip flexion 5 5   Knee extension 5 5   Knee flexion 5 5   Dorsiflexion 5 5   Plantarflexion 5 5   Great toe extension 5- 5-   Great toe flexion 5- 5-     Reflexes:  Right Left   Bicep 2+ 2+   Tricep 2+ 2+   BrRad 2+ 2+   Knee 2+ 2+   Ankle Trace Trace    Pathological Reflexes: Babinski: mute response bilaterally Hoffman: absent bilaterally Troemner: absent bilaterally Sensation: Pinprick: Intact in bilateral upper and lower extremities. Vibration: Intact in bilateral great toes. 13 seconds in right great toe, 11 seconds in left great toe Proprioception: Intact in bilateral great toes. Coordination: Intact finger-to- nose-finger bilaterally. Romberg negative. Gait: Able to rise from chair with arms crossed unassisted. Normal, narrow-based gait.  Lab and Test Review: Internal labs: 01/22/22: A1c: 5.9 Vit D: 37.1 BMP unremarkable  TSH (02/02/21): 3.54 B12 (10/10/17): < 150  ASSESSMENT: Sherry Wilkinson is a 77 y.o. female who presents for evaluation of pain and numbness in bilateral feet. She has a relevant medical history of pre-diabetes, osteoporosis, and B12 deficiency. Her neurological examination is pertinent for mildly reduced sensation to vibration in  bilateral great toes. Available diagnostic data is significant for B12 in 2019 of < 150 and HbA1c of 5.9. 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 Her known risk factors are pre-diabetes and B12 deficiency. I will send labs for other treatable causes. We discussed EMG, but patient deferred as she does not like needles.  PLAN: -Blood work: B1, B12, MMA, IFE -EMG deferred after discussion with patient -Lidocaine cream PRN -Alpha lipoic acid 600 mg daily  -Return to clinic in 6 months or sooner  if needed  The impression above as well as the plan as outlined below were extensively discussed with the patient who voiced understanding. All questions were answered to their satisfaction.  When available, results of the above investigations and possible further recommendations will be communicated to the patient via telephone/MyChart. Patient to call office if not contacted after expected testing turnaround time.   Total time spent reviewing records, interview, history/exam, documentation, and coordination of care on day of encounter:  40 min   Thank you for allowing me to participate in patient's care.  If I can answer any additional questions, I would be pleased to do so.  Kai Levins, MD   CC: Kinnie Feil, MD [place] [phone]  CC: Referring provider: Kinnie Feil, MD [address] [place],  [place] [address]

## 2022-05-15 ENCOUNTER — Ambulatory Visit (INDEPENDENT_AMBULATORY_CARE_PROVIDER_SITE_OTHER): Payer: Medicare Other | Admitting: Neurology

## 2022-05-15 ENCOUNTER — Other Ambulatory Visit (INDEPENDENT_AMBULATORY_CARE_PROVIDER_SITE_OTHER): Payer: Medicare Other

## 2022-05-15 ENCOUNTER — Encounter: Payer: Self-pay | Admitting: Neurology

## 2022-05-15 VITALS — BP 122/74 | HR 67 | Wt 140.4 lb

## 2022-05-15 DIAGNOSIS — R200 Anesthesia of skin: Secondary | ICD-10-CM

## 2022-05-15 DIAGNOSIS — R202 Paresthesia of skin: Secondary | ICD-10-CM

## 2022-05-15 DIAGNOSIS — R209 Unspecified disturbances of skin sensation: Secondary | ICD-10-CM | POA: Diagnosis not present

## 2022-05-15 DIAGNOSIS — G629 Polyneuropathy, unspecified: Secondary | ICD-10-CM

## 2022-05-15 DIAGNOSIS — M79672 Pain in left foot: Secondary | ICD-10-CM

## 2022-05-15 DIAGNOSIS — M79671 Pain in right foot: Secondary | ICD-10-CM

## 2022-05-15 DIAGNOSIS — E538 Deficiency of other specified B group vitamins: Secondary | ICD-10-CM

## 2022-05-15 LAB — VITAMIN B12: Vitamin B-12: 356 pg/mL (ref 211–911)

## 2022-05-15 NOTE — Patient Instructions (Addendum)
I saw you for pain and numbness in feet. This is likely due to nerve damage called neuropathy. Your low B12 and pre-diabetes put you at risk for neuropathy.  I would like to do blood work today to look for other causes.  We discussed EMG, but given you do not like needles, you passed on this today.  Your B12 was low. I would recommend supplementing with B12 1000 mcg daily.  You can also try Lidocaine cream as needed for your foot pain. Apply wear you have pain, tingling, or burning. Wear gloves to prevent your hands being numb. This can be bought over the counter at any drug store or online.   Alpha lipoic acid '600mg'$  daily: Has some research data but actual dose not well established as they used IV in the clinical research trials. No major side effects other than <1% of people report upset stomach. This can be taken twice per day ('1200mg'$  daily) if no relief obtained.  The physicians and staff at [facility] are committed to providing excellent care. You may receive a survey requesting feedback about your experience at our office. We strive to receive "very good" responses to the survey questions. If you feel that your experience would prevent you from giving the office a "very good " response, please contact our office to try to remedy the situation. We may be reached at [phone]. Thank you for taking the time out of your busy day to complete the survey.  Kai Levins, MD [facility]

## 2022-05-22 LAB — METHYLMALONIC ACID, SERUM: Methylmalonic Acid, Quant: 157 nmol/L (ref 87–318)

## 2022-05-22 LAB — IMMUNOFIXATION ELECTROPHORESIS
IgG (Immunoglobin G), Serum: 1159 mg/dL (ref 600–1540)
IgM, Serum: 97 mg/dL (ref 50–300)
Immunoglobulin A: 273 mg/dL (ref 70–320)

## 2022-05-22 LAB — VITAMIN B1: Vitamin B1 (Thiamine): 8 nmol/L (ref 8–30)

## 2022-06-02 NOTE — Patient Instructions (Signed)

## 2022-06-02 NOTE — Progress Notes (Unsigned)
I connected with  Sherry Wilkinson on 06/03/2022 by a audio enabled telemedicine application and verified that I am speaking with the correct person using two identifiers.  Patient Location: Home  Provider Location: Home Office  I discussed the limitations of evaluation and management by telemedicine. The patient expressed understanding and agreed to proceed.  Subjective:   Sherry Wilkinson is a 77 y.o. female who presents for Medicare Annual (Subsequent) preventive examination.  Review of Systems    Per HPI unless specifically indicated below.  Cardiac Risk Factors include: advanced age (>45mn, >>3women);female gender and hyperlipidemia.           Objective:       05/15/2022    9:06 AM 01/22/2022    9:59 AM 11/30/2021   11:37 AM  Vitals with BMI  Height  5' 5"$    Weight 140 lbs 6 oz 140 lbs 6 oz   BMI  [id number]  Systolic [id number] Diastolic 74 62 59  Pulse 67 69 45    Today's Vitals   06/03/22 0918  PainSc: 0-No pain   There is no height or weight on file to calculate BMI.     06/03/2022    9:25 AM 05/15/2022    9:09 AM 01/22/2022   10:01 AM 09/21/2021    9:38 AM 06/26/2021    9:18 AM 02/02/2021    8:36 AM 09/12/2020    9:15 AM  Advanced Directives  Does Patient Have a Medical Advance Directive? Yes Yes No No No No No  Type of [place] will Living will       Does patient want to make changes to medical advance directive? No - Patient declined        Copy of [place]abin Chart? No - copy requested        Would patient like information on creating a medical advance directive?   No - Patient declined No - Patient declined No - Patient declined No - Patient declined No - Patient declined    Current Medications (verified) Outpatient Encounter Medications as of 06/03/2022  Medication Sig   Cholecalciferol (VITAMIN D3) 75 MCG (3000 UT) TABS Take 1 tablet by mouth.   ferrous sulfate 325 (65 FE) MG EC  tablet Take 325 mg by mouth every morning.   vitamin B-12 (CYANOCOBALAMIN) 500 MCG tablet Take 500 mcg by mouth daily.   vitamin C (ASCORBIC ACID) 250 MG tablet Take 250 mg by mouth daily.   No facility-administered encounter medications on file as of 06/03/2022.    Allergies (verified) Patient has no known allergies.   History: Past Medical History:  Diagnosis Date   Anemia    Appendicitis    Arthritis    all over - feet and knees especially    Cataract    Liver cyst    Osteopenia    Paresthesia 10/10/2017   Perineal abscess 12/16/2014   Past Surgical History:  Procedure Laterality Date   APPENDECTOMY  1970   COLONOSCOPY  2018   COLONOSCOPY WITH PROPOFOL  07/25/2020   Dr.Pyrtle   POLYPECTOMY     UTERINE FIBROID SURGERY     WRIST SURGERY     Family History  Problem Relation Age of Onset   Heart disease Mother    Heart disease Maternal Grandmother    Colon cancer Neg Hx    Esophageal cancer Neg Hx    Rectal cancer Neg Hx  Stomach cancer Neg Hx    Colon polyps Neg Hx    Social History   Socioeconomic History   Marital status: Divorced    Spouse name: Not on file   Number of children: 0   Years of education: Not on file   Highest education level: Not on file  Occupational History   Occupation: Retired  Tobacco Use   Smoking status: Former    Types: Cigarettes    Quit date: 08/21/1998    Years since quitting: 23.8   Smokeless tobacco: Never  Vaping Use   Vaping Use: Never used  Substance and Sexual Activity   Alcohol use: No   Drug use: No   Sexual activity: Never  Other Topics Concern   Not on file  Social History Narrative   Are you right handed or left handed? right   Are you currently employed ?    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 yes   Who lives with you?    What type of home do you live in: 1 story or 2 story? one    No caffeine   Social Determinants of Health   Financial Resource Strain: Low Risk   (06/03/2022)   Overall Financial Resource Strain (CARDIA)    Difficulty of Paying Living Expenses: Not hard at all  Food Insecurity: No Food Insecurity (06/03/2022)   Hunger Vital Sign    Worried About Running Out of Food in the Last Year: Never true    Ran Out of Food in the Last Year: Never true  Transportation Needs: No Transportation Needs (06/03/2022)   PRAPARE - Hydrologist (Medical): No    Lack of Transportation (Non-Medical): No  Physical Activity: Insufficiently Active (06/03/2022)   Exercise Vital Sign    Days of Exercise per Week: 7 days    Minutes of Exercise per Session: 10 min  Stress: No Stress Concern Present (06/03/2022)   [place]    Feeling of Stress : Not at all  Social Connections: Socially Isolated (06/03/2022)   Social Connection and Isolation Panel [NHANES]    Frequency of Communication with Friends and Family: Once a week    Frequency of Social Gatherings with Friends and Family: Once a week    Attends Religious Services: More than 4 times per year    Active Member of [employer] or Organizations: No    Attends Music therapist: Never    Marital Status: Divorced    Tobacco Counseling Counseling given: No   Clinical Intake:  Pre-visit preparation completed: No  Pain : No/denies pain Pain Score: 0-No pain     Nutritional Status: BMI of 19-24  Normal Diabetes: No  How often do you need to have someone help you when you read instructions, pamphlets, or other written materials from your doctor or pharmacy?: 1 - Never  Diabetic?No  Interpreter Needed?: No  Information entered by :: Donnie Mesa, CMA   Activities of Daily Living    06/03/2022    9:16 AM 06/03/2022    9:15 AM  In your present state of health, do you have any difficulty performing the following activities:  Hearing? 0 0  Vision? 0   Difficulty concentrating or making decisions?  1   Walking or climbing stairs? 0   Dressing or bathing? 0   Doing errands, shopping? 0     Patient Care Team: Kinnie Feil,  MD as PCP - General (Family Medicine)  Indicate any recent Medical Services you may have received from other than [facility] providers in the past year (date may be approximate).     Assessment:   This is a routine wellness examination for Sherry Wilkinson.  Hearing/Vision screen Denies any hearing issues.  Denies any vision changes. Bi-Annual Eye Exam. Dr. Tamala Julian   Dietary issues and exercise activities discussed: Current Exercise Habits: Structured exercise class, Type of exercise: yoga, Time (Minutes): 10, Frequency (Times/Week): 7, Weekly Exercise (Minutes/Week): 70, Intensity: Mild, Exercise limited by: neurologic condition(s)   Goals Addressed             This Visit's Progress    Stay Active and Independent       Why is this important?   Regular activity or exercise.  Activity helps to keep your muscles strong.  You will sleep better and feel more relaxed.  You will have more energy and feel less stressed.  If you are not active now, start slowly. Little changes make a big difference.  Rest, but not too much.  Stay as active as you can and listen to your body's signals.           Depression Screen    06/03/2022    9:15 AM 01/22/2022   10:02 AM 09/21/2021    9:37 AM 02/02/2021    8:36 AM 09/12/2020    9:14 AM 03/24/2020    9:16 AM 12/24/2019    2:58 PM  PHQ 2/9 Scores  PHQ - 2 Score 0  0 0 0 0 0  PHQ- 9 Score   0 0 0 0 0  Exception Documentation  Patient refusal         Fall Risk    06/03/2022    9:15 AM 05/15/2022    9:08 AM 01/22/2022   10:02 AM 01/22/2022   10:00 AM 09/21/2021    9:38 AM  Fall Risk   Falls in the past year? 0 0 0 0 0  Number falls in past yr: 0 0 0 0 0  Injury with Fall? 0 0 0 0 0  Risk for fall due to : No Fall Risks      Follow up Falls evaluation completed Falls evaluation completed       FALL RISK PREVENTION  PERTAINING TO THE HOME:  Any stairs in or around the home? No  If so, are there any without handrails? No  Home free of loose throw rugs in walkways, pet beds, electrical cords, etc? No Adequate lighting in your home to reduce risk of falls? Yes   ASSISTIVE DEVICES UTILIZED TO PREVENT FALLS:  Life alert? No  Use of a cane, walker or w/c? No  Grab bars in the bathroom? Yes  Shower chair or bench in shower? No  Elevated toilet seat or a handicapped toilet? No   TIMED UP AND GO:  Was the test performed? Unable to perform, virtual appointment    Cognitive Function:        06/03/2022    9:16 AM  6CIT Screen  What Year? 0 points  What month? 0 points  What time? 0 points  Count back from 20 0 points  Months in reverse 0 points  Repeat phrase 0 points  Total Score 0 points    Immunizations  There is no immunization history on file for this patient.  TDAP status: Due, Education has been provided regarding the importance of this vaccine. Advised may receive this vaccine  at local pharmacy or Health Dept. Aware to provide a copy of the vaccination record if obtained from local pharmacy or Health Dept. Verbalized acceptance and understanding.  Flu Vaccine status: Declined, Education has been provided regarding the importance of this vaccine but patient still declined. Advised may receive this vaccine at local pharmacy or Health Dept. Aware to provide a copy of the vaccination record if obtained from local pharmacy or Health Dept. Verbalized acceptance and understanding.  Pneumococcal vaccine status: Due, Education has been provided regarding the importance of this vaccine. Advised may receive this vaccine at local pharmacy or Health Dept. Aware to provide a copy of the vaccination record if obtained from local pharmacy or Health Dept. Verbalized acceptance and understanding.  Covid-19 vaccine status: Declined, Education has been provided regarding the importance of this vaccine but  patient still declined. Advised may receive this vaccine at local pharmacy or Health Dept.or vaccine clinic. Aware to provide a copy of the vaccination record if obtained from local pharmacy or Health Dept. Verbalized acceptance and understanding.  Qualifies for Shingles Vaccine? Yes   Zostavax completed No   Shingrix Completed?: No.    Education has been provided regarding the importance of this vaccine. Patient has been advised to call insurance company to determine out of pocket expense if they have not yet received this vaccine. Advised may also receive vaccine at local pharmacy or Health Dept. Verbalized acceptance and understanding.  Screening Tests Health Maintenance  Topic Date Due   DTaP/Tdap/Td (1 - Tdap) Never done   Pneumonia Vaccine 29+ Years old (1 of 1 - PCV) 06/27/2022 (Originally 04/09/2010)   INFLUENZA VACCINE  01/21/2023 (Originally 11/20/2021)   COLONOSCOPY (Pts 45-89yr Insurance coverage will need to be confirmed)  12/01/2022   Medicare Annual Wellness (AWV)  06/04/2023   DEXA SCAN  Completed   Hepatitis C Screening  Completed   HPV VACCINES  Aged Out   COVID-19 Vaccine  Discontinued   Zoster Vaccines- Shingrix  Discontinued    Health Maintenance  Health Maintenance Due  Topic Date Due   DTaP/Tdap/Td (1 - Tdap) Never done    Colorectal cancer screening: Type of screening: Colonoscopy. Completed 11/30/2021. Repeat every 3 years  Mammogram status: Completed 12/05/2021. Repeat every year  DEXA Scan: completed 12/31/2021   Lung Cancer Screening: (Low Dose CT Chest recommended if Age 78-80years, 30 pack-year currently smoking OR have quit w/in 15years.) does not qualify.   Lung Cancer Screening Referral: not applicable   Additional Screening:  Hepatitis C Screening: does qualify; Completed 10/17/2015  Vision Screening: Recommended annual ophthalmology exams for early detection of glaucoma and other disorders of the eye. Is the patient up to date with their  annual eye exam?  No, Bi-Annual  Who is the provider or what is the name of the office in which the patient attends annual eye exams? Dr. STamala JulianIf pt is not established with a provider, would they like to be referred to a provider to establish care? No .   Dental Screening: Recommended annual dental exams for proper oral hygiene  Community Resource Referral / Chronic Care Management: CRR required this visit?  No   CCM required this visit?  No      Plan:     I have personally reviewed and noted the following in the patient's chart:   Medical and social history Use of alcohol, tobacco or illicit drugs  Current medications and supplements including opioid prescriptions. Patient is not currently taking opioid prescriptions. Functional ability  and status Nutritional status Physical activity Advanced directives List of other physicians Hospitalizations, surgeries, and ER visits in previous 12 months Vitals Screenings to include cognitive, depression, and falls Referrals and appointments  In addition, I have reviewed and discussed with patient certain preventive protocols, quality metrics, and best practice recommendations. A written personalized care plan for preventive services as well as general preventive health recommendations were provided to patient.   Sherry Wilkinson , Thank you for taking time to come for your Medicare Wellness Visit. I appreciate your ongoing commitment to your health goals. Please review the following plan we discussed and let me know if I can assist you in the future.   These are the goals we discussed:  Goals      Blood Pressure < 140/90     Exercise 150 minutes per week (moderate activity)     Stay Active and Independent     Why is this important?   Regular activity or exercise.  Activity helps to keep your muscles strong.  You will sleep better and feel more relaxed.  You will have more energy and feel less stressed.  If you are not active now,  start slowly. Little changes make a big difference.  Rest, but not too much.  Stay as active as you can and listen to your body's signals.         Weight < 143 lb (64.864 kg)     5% weight loss        This is a list of the screening recommended for you and due dates:  Health Maintenance  Topic Date Due   DTaP/Tdap/Td vaccine (1 - Tdap) Never done   Pneumonia Vaccine (1 of 1 - PCV) 06/27/2022*   Flu Shot  01/21/2023*   Colon Cancer Screening  12/01/2022   Medicare Annual Wellness Visit  06/04/2023   DEXA scan (bone density measurement)  Completed   Hepatitis C Screening: USPSTF Recommendation to screen - Ages 25-79 yo.  Completed   HPV Vaccine  Aged Out   COVID-19 Vaccine  Discontinued   Zoster (Shingles) Vaccine  Discontinued  *Topic was postponed. The date shown is not the original due date.      Wilson Singer, [place]   06/03/2022  Nurse Notes: Approximately 30 minute Non-Face -To-Face Medicare Wellness Visit

## 2022-06-03 ENCOUNTER — Ambulatory Visit (INDEPENDENT_AMBULATORY_CARE_PROVIDER_SITE_OTHER): Payer: Medicare Other

## 2022-06-03 DIAGNOSIS — Z0000 Encounter for general adult medical examination without abnormal findings: Secondary | ICD-10-CM | POA: Diagnosis not present

## 2022-06-07 ENCOUNTER — Encounter: Payer: Self-pay | Admitting: Family Medicine

## 2022-06-07 ENCOUNTER — Ambulatory Visit (INDEPENDENT_AMBULATORY_CARE_PROVIDER_SITE_OTHER): Payer: Medicare Other | Admitting: Family Medicine

## 2022-06-07 VITALS — BP 124/67 | HR 68 | Ht 65.0 in | Wt 142.2 lb

## 2022-06-07 DIAGNOSIS — R5383 Other fatigue: Secondary | ICD-10-CM | POA: Insufficient documentation

## 2022-06-07 DIAGNOSIS — E785 Hyperlipidemia, unspecified: Secondary | ICD-10-CM | POA: Diagnosis not present

## 2022-06-07 NOTE — Assessment & Plan Note (Signed)
Diet controlled. FLP checked today. I will contact her soon with her result.

## 2022-06-07 NOTE — Patient Instructions (Signed)
Please obtain tetanus shot at the pharmacy.   Fatigue If you have fatigue, you feel tired all the time and have a lack of energy or a lack of motivation. Fatigue may make it difficult to start or complete tasks because of exhaustion. Occasional or mild fatigue is often a normal response to activity or life. However, long-term (chronic) or extreme fatigue may be a symptom of a medical condition such as: Depression. Not having enough red blood cells or hemoglobin in the blood (anemia). A problem with a small gland located in the lower front part of the neck (thyroid disorder). Rheumatologic conditions. These are problems related to the body's defense system (immune system). Infections, especially certain viral infections. Fatigue can also lead to negative health outcomes over time. Follow these instructions at home: Medicines Take over-the-counter and prescription medicines only as told by your health care provider. Take a multivitamin if told by your health care provider. Do not use herbal or dietary supplements unless they are approved by your health care provider. Eating and drinking  Avoid heavy meals in the evening. Eat a well-balanced diet, which includes lean proteins, whole grains, plenty of fruits and vegetables, and low-fat dairy products. Avoid eating or drinking too many products with caffeine in them. Avoid alcohol. Drink enough fluid to keep your urine pale yellow. Activity  Exercise regularly, as told by your health care provider. Use or practice techniques to help you relax, such as yoga, tai chi, meditation, or massage therapy. Lifestyle Change situations that cause you stress. Try to keep your work and personal schedules in balance. Do not use recreational or illegal drugs. General instructions Monitor your fatigue for any changes. Go to bed and get up at the same time every day. Avoid fatigue by pacing yourself during the day and getting enough sleep at  night. Maintain a healthy weight. Contact a health care provider if: Your fatigue does not get better. You have a fever. You suddenly lose or gain weight. You have headaches. You have trouble falling asleep or sleeping through the night. You feel angry, guilty, anxious, or sad. You have swelling in your legs or another part of your body. Get help right away if: You feel confused, feel like you might faint, or faint. Your vision is blurry or you have a severe headache. You have severe pain in your abdomen, your back, or the area between your waist and hips (pelvis). You have chest pain, shortness of breath, or an irregular or fast heartbeat. You are unable to urinate, or you urinate less than normal. You have abnormal bleeding from the rectum, nose, lungs, nipples, or, if you are female, the vagina. You vomit blood. You have thoughts about hurting yourself or others. These symptoms may be an emergency. Get help right away. Call 911. Do not wait to see if the symptoms will go away. Do not drive yourself to the hospital. Get help right away if you feel like you may hurt yourself or others, or have thoughts about taking your own life. Go to your nearest emergency room or: Call 911. Call the [facility] at [phone] or [phone]. This is open 24 hours a day. Text the Crisis Text Line at 580 665 1702. Summary If you have fatigue, you feel tired all the time and have a lack of energy or a lack of motivation. Fatigue may make it difficult to start or complete tasks because of exhaustion. Long-term (chronic) or extreme fatigue may be a symptom of a medical condition.  Exercise regularly, as told by your health care provider. Change situations that cause you stress. Try to keep your work and personal schedules in balance. This information is not intended to replace advice given to you by your health care provider. Make sure you discuss any questions you have with your health  care provider. Document Revised: 01/29/2021 Document Reviewed: 01/29/2021 Elsevier Patient Education  [place].

## 2022-06-07 NOTE — Progress Notes (Signed)
    SUBJECTIVE:   CHIEF COMPLAINT / HPI:   Fatigue: The patient is here for fatigue. This has been going on for over a few months. However, it is now worsening. She denies cardiopulmonary or GI symptoms. No fever, no stress, anxiety, or depression. No major weight changes. She feels well otherwise.   HLD - Here for f/u for FLP.  PERTINENT  PMH / PSH: PMHx reviewed  OBJECTIVE:   BP 124/67   Pulse 68   Ht 5' 5"$  (1.651 m)   Wt 142 lb 3.2 oz (64.5 kg)   SpO2 100%   BMI 23.66 kg/m   Physical Exam Vitals and nursing note reviewed.  Neck:     Comments: No thyromegaly Cardiovascular:     Rate and Rhythm: Normal rate and regular rhythm.     Heart sounds: Normal heart sounds. No murmur heard. Pulmonary:     Effort: Pulmonary effort is normal. No respiratory distress.     Breath sounds: Normal breath sounds. No wheezing.  Abdominal:     General: Abdomen is flat. Bowel sounds are normal. There is no distension.     Palpations: There is no mass.     Tenderness: There is no abdominal tenderness.  Musculoskeletal:     Right lower leg: No edema.     Left lower leg: No edema.  Neurological:     Mental Status: She is oriented to person, place, and time.    [place] Office Visit from 06/07/2022 in [facility]  PHQ-9 Total Score 0        ASSESSMENT/PLAN:   Hyperlipidemia Diet controlled. FLP checked today. I will contact her soon with her result.   Fatigue ?? Myalgia encephalomyelitis (Chronic Fatigue Syndrome). R/O metabolic or endocrine conditions. Cmet, TSH, CBC checked. I will contact her with results and further recommendations. Graded exercise might help. Consider referral to [facility] for pool therapy in the future.    Recommend obtaining Tdap at the pharmacy.   Andrena Mews, MD [place]

## 2022-06-07 NOTE — Assessment & Plan Note (Signed)
??   Myalgia encephalomyelitis (Chronic Fatigue Syndrome). R/O metabolic or endocrine conditions. Cmet, TSH, CBC checked. I will contact her with results and further recommendations. Graded exercise might help. Consider referral to [facility] for pool therapy in the future.

## 2022-06-08 LAB — LIPID PANEL
Chol/HDL Ratio: 2.6 ratio (ref 0.0–4.4)
Cholesterol, Total: 189 mg/dL (ref 100–199)
HDL: 74 mg/dL (ref >39)
LDL Chol Calc (NIH): 100 mg/dL — ABNORMAL HIGH (ref 0–99)
Triglycerides: 84 mg/dL (ref 0–149)
VLDL Cholesterol Cal: 15 mg/dL (ref 5–40)

## 2022-06-08 LAB — ANEMIA PROFILE B
Basophils Absolute: 0.1 10*3/uL (ref 0.0–0.2)
Basos: 1 %
EOS (ABSOLUTE): 0.1 10*3/uL (ref 0.0–0.4)
Eos: 2 %
Ferritin: 19 ng/mL (ref 15–150)
Folate: 16.7 ng/mL (ref >3.0)
Hematocrit: 40.3 % (ref 34.0–46.6)
Hemoglobin: 13.6 g/dL (ref 11.1–15.9)
Immature Grans (Abs): 0 10*3/uL (ref 0.0–0.1)
Immature Granulocytes: 0 %
Iron Saturation: 12 % — ABNORMAL LOW (ref 15–55)
Iron: 42 ug/dL (ref 27–139)
Lymphocytes Absolute: 2.1 10*3/uL (ref 0.7–3.1)
Lymphs: 26 %
MCH: 30.5 pg (ref 26.6–33.0)
MCHC: 33.7 g/dL (ref 31.5–35.7)
MCV: 90 fL (ref 79–97)
Monocytes Absolute: 0.6 10*3/uL (ref 0.1–0.9)
Monocytes: 8 %
Neutrophils Absolute: 5 10*3/uL (ref 1.4–7.0)
Neutrophils: 63 %
Platelets: 324 10*3/uL (ref 150–450)
RBC: 4.46 x10E6/uL (ref 3.77–5.28)
RDW: 12.7 % (ref 11.7–15.4)
Retic Ct Pct: 1.2 % (ref 0.6–2.6)
Total Iron Binding Capacity: 361 ug/dL (ref 250–450)
UIBC: 319 ug/dL (ref 118–369)
Vitamin B-12: 568 pg/mL (ref 232–1245)
WBC: 7.9 10*3/uL (ref 3.4–10.8)

## 2022-06-08 LAB — CMP14+EGFR
ALT: 15 IU/L (ref 0–32)
AST: 21 IU/L (ref 0–40)
Albumin/Globulin Ratio: 1.8 (ref 1.2–2.2)
Albumin: 4.3 g/dL (ref 3.8–4.8)
Alkaline Phosphatase: 105 IU/L (ref 44–121)
BUN/Creatinine Ratio: 12 (ref 12–28)
BUN: 11 mg/dL (ref 8–27)
Bilirubin Total: 0.4 mg/dL (ref 0.0–1.2)
CO2: 23 mmol/L (ref 20–29)
Calcium: 9.8 mg/dL (ref 8.7–10.3)
Chloride: 105 mmol/L (ref 96–106)
Creatinine, Ser: 0.92 mg/dL (ref 0.57–1.00)
Globulin, Total: 2.4 g/dL (ref 1.5–4.5)
Glucose: 94 mg/dL (ref 70–99)
Potassium: 4.3 mmol/L (ref 3.5–5.2)
Sodium: 143 mmol/L (ref 134–144)
Total Protein: 6.7 g/dL (ref 6.0–8.5)
eGFR: 64 mL/min/{1.73_m2} (ref >59)

## 2022-06-08 LAB — TSH: TSH: 2.61 u[IU]/mL (ref 0.450–4.500)

## 2022-06-10 ENCOUNTER — Encounter: Payer: Self-pay | Admitting: Family Medicine

## 2022-06-10 ENCOUNTER — Telehealth: Payer: Self-pay | Admitting: Family Medicine

## 2022-06-10 NOTE — Telephone Encounter (Addendum)
HIPAA compliant callback message left.   Please advise when she calls: Ferritin level is less than 30 suggestive of iron deficiency anemia. Although her hemoglobin level is normal, I will recommend that she continues her daily or every other day iron supplements.  Liver and Kidney test with electrolytes are normal.  Lipid test LDL is same with an intermediate risk for cardiovascular diseases (MI or stroke). Moderate intensity statins is recommended. Follow up soon to discuss.  Result letter also send via admin.

## 2022-06-10 NOTE — Telephone Encounter (Signed)
Patient returns call to nurse line.   Results discussed with patient and all questions answered.   Advised she will get a letter in the mail with a detailed report.

## 2022-08-12 ENCOUNTER — Other Ambulatory Visit: Payer: Self-pay | Admitting: Family Medicine

## 2022-08-12 DIAGNOSIS — Z1231 Encounter for screening mammogram for malignant neoplasm of breast: Secondary | ICD-10-CM

## 2022-08-21 ENCOUNTER — Encounter: Payer: Self-pay | Admitting: Family Medicine

## 2022-08-21 ENCOUNTER — Other Ambulatory Visit: Payer: Self-pay | Admitting: Family Medicine

## 2022-08-21 ENCOUNTER — Ambulatory Visit (INDEPENDENT_AMBULATORY_CARE_PROVIDER_SITE_OTHER): Payer: Medicare Other | Admitting: Family Medicine

## 2022-08-21 VITALS — BP 115/67 | HR 71 | Ht 65.0 in | Wt 138.1 lb

## 2022-08-21 DIAGNOSIS — L608 Other nail disorders: Secondary | ICD-10-CM | POA: Diagnosis not present

## 2022-08-21 DIAGNOSIS — E611 Iron deficiency: Secondary | ICD-10-CM

## 2022-08-21 DIAGNOSIS — R5383 Other fatigue: Secondary | ICD-10-CM | POA: Diagnosis not present

## 2022-08-21 DIAGNOSIS — E785 Hyperlipidemia, unspecified: Secondary | ICD-10-CM | POA: Diagnosis not present

## 2022-08-21 DIAGNOSIS — R7303 Prediabetes: Secondary | ICD-10-CM

## 2022-08-21 MED ORDER — CICLOPIROX 8 % EX SOLN: Freq: Every day | CUTANEOUS | 0 refills | Status: DC

## 2022-08-21 NOTE — Assessment & Plan Note (Signed)
Chronic recurrent. No acute findings on her exam. May be due to age related deconditioning.  Graded exercise discussed. Plan to check Cmet, Anemia panel with her iron deficiency and A1C. She will come back for lab appointment as the lab is currently closed. Lab appointment scheduled.

## 2022-08-21 NOTE — Assessment & Plan Note (Addendum)
With hyperkeratosis. ?? Beau lines in iron deficiency. Anemia panel future order placed. F/U for nail clipping for fungal culture. Trial of cilopirox I will contact her soon with her test results.

## 2022-08-21 NOTE — Assessment & Plan Note (Signed)
Test results discussed. LDL:100 Moderate intensity statin recommended. However, we opted for lifestyle modification given her age. Monitor closely.

## 2022-08-21 NOTE — Progress Notes (Addendum)
    SUBJECTIVE:   CHIEF COMPLAINT / HPI:   Right big toe discoloration: C/O discoloration of her right big toe, which has been going on for a few weeks. She denies trauma to her toe. No other symptoms.  Fatigue: She c/o feeling drained 6 days ago. This lasted for two days, and she could not gather the energy to get up and go. She denies any associated symptoms. No change in her diet or bowel habits. No major weight changes. She feels better now but requested lab work to evaluate her symptoms further.   HLD: Not on meds. Here for f/u.  PERTINENT  PMH / PSH: PMHx reviewed  OBJECTIVE:   BP 115/67   Pulse 71   Ht 5\' 5"  (1.651 m)   Wt 138 lb 2 oz (62.7 kg)   SpO2 100%   BMI 22.99 kg/m   Physical Exam Vitals and nursing note reviewed.  Cardiovascular:     Rate and Rhythm: Normal rate and regular rhythm.     Heart sounds: Normal heart sounds. No murmur heard. Pulmonary:     Effort: Pulmonary effort is normal. No respiratory distress.     Breath sounds: Normal breath sounds. No wheezing.  Abdominal:     General: Bowel sounds are normal. There is no distension.     Palpations: There is no mass.  Musculoskeletal:     Right lower leg: No edema.     Left lower leg: No edema.     Comments: Thickened, mildly hyperpigmented of the proximal 2/3rd of her right first toenail.      ASSESSMENT/PLAN:   Fatigue Chronic recurrent. No acute findings on her exam. May be due to age related deconditioning.  Graded exercise discussed. Plan to check Cmet, Anemia panel with her iron deficiency and A1C. She will come back for lab appointment as the lab is currently closed. Lab appointment scheduled.  Nail discoloration With hyperkeratosis. ?? Beau lines in iron deficiency. Anemia panel future order placed. F/U for nail clipping for fungal culture. Trial of cilopirox I will contact her soon with her test results.  Hyperlipidemia Test results discussed. LDL:100 Moderate intensity  statin recommended. However, we opted for lifestyle modification given her age. Monitor closely.   She declined Tdap and PCV20 shots.  Janit Pagan, MD [facility]

## 2022-08-21 NOTE — Patient Instructions (Signed)
Iron Deficiency Anemia, Adult  Iron deficiency anemia is when you do not have enough red blood cells or hemoglobin in your blood. This happens because you have too little iron in your body. Hemoglobin carries oxygen to parts of the body. Anemia can cause your body to not get enough oxygen. What are the causes? Not eating enough foods that have iron in them. The body not being able to take in iron well. Blood loss. What increases the risk? Having menstrual periods. Being pregnant. What are the signs or symptoms? Pale skin, lips, and nails. Weakness, dizziness, and getting tired easily. Feeling like you cannot breathe well when moving (shortness of breath). Cold hands and feet. Mild anemia may not cause any symptoms. How is this treated? This condition is treated by finding out why you do not have enough iron and then getting more iron. It may include: Adding foods to your diet that have a lot of iron. Taking iron pills (supplements). If you are pregnant or breastfeeding, you may need to take extra iron. Your diet often does not provide the amount of iron that you need. Getting more vitamin C in your diet. Vitamin C helps your body take in iron. You may need to take iron pills with a glass of orange juice or vitamin C pills. Medicines to make heavy menstrual periods lighter. Surgery or testing procedures to find what is causing the condition. You may need blood tests to see if treatment is working. If the treatment does not seem to be working, you may need more tests. Follow these instructions at home: Medicines Take over-the-counter and prescription medicines only as told by your doctor. This includes iron pills and vitamins. Taking them as told is important because too much iron can be harmful. Take iron pills when your stomach is empty. If you cannot handle this, take them with food. Do not drink milk or take antacids at the same time as your iron pills. Iron pills may turn your poop  (stool)black. If you cannot handle taking iron pills by mouth, ask your doctor about getting iron through: An IV tube. A shot (injection) into a muscle. Eating and drinking Talk with your doctor before changing the foods you eat. Your doctor may tell you to eat foods that have a lot of iron, such as: Liver. Low-fat (lean) beef. Breads and cereals that have iron added to them. Eggs. Dried fruit. Dark green, leafy vegetables. Eat fresh fruits and vegetables that are high in vitamin C. They help your body use iron. Foods with a lot of vitamin C include: Oranges. Peppers. Tomatoes. Mangoes. Managing constipation If you are taking iron pills, they may cause trouble pooping (constipation). To prevent or treat this, you may need to: Drink enough fluid to keep your pee (urine) pale yellow. Take over-the-counter or prescription medicines. Eat foods that are high in fiber. These include beans, whole grains, and fresh fruits and vegetables. Limit foods that are high in fat and sugar. These include fried or sweet foods. General instructions Return to your normal activities when your doctor says that it is safe. Keep all follow-up visits. Contact a doctor if: You feel like you may vomit (nauseous), or you vomit. You feel weak. You get light-headed when getting up from sitting or lying down. You are sweating for no reason. You have trouble pooping. You have worse breathing with physical activity. You have heaviness in your chest. Get help right away if: You faint. If this happens, do not drive yourself   to the hospital. You have a fast heartbeat, or a heartbeat that does not feel regular. Summary Iron deficiency anemia happens when you have too little iron in your body. This condition is treated by finding out why you do not have enough iron in your body and then getting more iron. Take over-the-counter and prescription medicines only as told by your doctor. Eat fresh fruits and vegetables  that are high in vitamin C. Contact a doctor if you have trouble pooping or feel weak. This information is not intended to replace advice given to you by your health care provider. Make sure you discuss any questions you have with your health care provider. Document Revised: 05/17/2021 Document Reviewed: 05/17/2021 Elsevier Patient Education  2023 Elsevier Inc.  

## 2022-08-22 ENCOUNTER — Other Ambulatory Visit: Payer: Medicare Other

## 2022-08-22 DIAGNOSIS — E611 Iron deficiency: Secondary | ICD-10-CM

## 2022-08-22 DIAGNOSIS — R7303 Prediabetes: Secondary | ICD-10-CM

## 2022-08-23 ENCOUNTER — Encounter: Payer: Self-pay | Admitting: Family Medicine

## 2022-08-23 ENCOUNTER — Telehealth: Payer: Self-pay | Admitting: Family Medicine

## 2022-08-23 LAB — ANEMIA PROFILE B
Basophils Absolute: 0.1 10*3/uL (ref 0.0–0.2)
Basos: 1 %
EOS (ABSOLUTE): 0.1 10*3/uL (ref 0.0–0.4)
Eos: 2 %
Ferritin: 26 ng/mL (ref 15–150)
Folate: 15.4 ng/mL (ref >3.0)
Hematocrit: 38.5 % (ref 34.0–46.6)
Hemoglobin: 12.7 g/dL (ref 11.1–15.9)
Immature Grans (Abs): 0 10*3/uL (ref 0.0–0.1)
Immature Granulocytes: 0 %
Iron Saturation: 22 % (ref 15–55)
Iron: 76 ug/dL (ref 27–139)
Lymphocytes Absolute: 2.2 10*3/uL (ref 0.7–3.1)
Lymphs: 37 %
MCH: 29.3 pg (ref 26.6–33.0)
MCHC: 33 g/dL (ref 31.5–35.7)
MCV: 89 fL (ref 79–97)
Monocytes Absolute: 0.6 10*3/uL (ref 0.1–0.9)
Monocytes: 11 %
Neutrophils Absolute: 2.9 10*3/uL (ref 1.4–7.0)
Neutrophils: 49 %
Platelets: 294 10*3/uL (ref 150–450)
RBC: 4.34 x10E6/uL (ref 3.77–5.28)
RDW: 13 % (ref 11.7–15.4)
Retic Ct Pct: 1.1 % (ref 0.6–2.6)
Total Iron Binding Capacity: 351 ug/dL (ref 250–450)
UIBC: 275 ug/dL (ref 118–369)
Vitamin B-12: 544 pg/mL (ref 232–1245)
WBC: 5.8 10*3/uL (ref 3.4–10.8)

## 2022-08-23 LAB — CMP14+EGFR
ALT: 17 IU/L (ref 0–32)
AST: 21 IU/L (ref 0–40)
Albumin/Globulin Ratio: 2 (ref 1.2–2.2)
Albumin: 4.3 g/dL (ref 3.8–4.8)
Alkaline Phosphatase: 102 IU/L (ref 44–121)
BUN/Creatinine Ratio: 9 — ABNORMAL LOW (ref 12–28)
BUN: 9 mg/dL (ref 8–27)
Bilirubin Total: 0.5 mg/dL (ref 0.0–1.2)
CO2: 19 mmol/L — ABNORMAL LOW (ref 20–29)
Calcium: 9.4 mg/dL (ref 8.7–10.3)
Chloride: 106 mmol/L (ref 96–106)
Creatinine, Ser: 0.98 mg/dL (ref 0.57–1.00)
Globulin, Total: 2.2 g/dL (ref 1.5–4.5)
Glucose: 90 mg/dL (ref 70–99)
Potassium: 4.2 mmol/L (ref 3.5–5.2)
Sodium: 142 mmol/L (ref 134–144)
Total Protein: 6.5 g/dL (ref 6.0–8.5)
eGFR: 59 mL/min/{1.73_m2} — ABNORMAL LOW (ref >59)

## 2022-08-23 LAB — HEMOGLOBIN A1C
Est. average glucose Bld gHb Est-mCnc: 117 mg/dL
Hgb A1c MFr Bld: 5.7 % — ABNORMAL HIGH (ref 4.8–5.6)

## 2022-08-23 NOTE — Telephone Encounter (Signed)
Lab result discussed. Looks reassuring.  Did not explain reason for her fatigue. She however stated that she feels much better today. Monitor closely for now. She requested a copy of her result mailed to her. I will message the admin group to mail result to her home. She was appreciative of the call.

## 2022-08-23 NOTE — Progress Notes (Signed)
Hello Admin team,  Please mail result letter to patient's home. Thanks.   Dr. Lum Babe

## 2022-11-04 NOTE — Progress Notes (Signed)
I saw Sherry Wilkinson in neurology clinic on 11/15/22 in follow up for pain in feet.  HPI: Sherry Wilkinson is a 77 y.o. year old right-handed female with a medical history of pre-diabetes, osteoporosis, B12 deficiency who we last saw on 05/15/22.  To briefly review: 05/15/22: Patient has been having problem in her feet for about 1 year. She has pain in both feet described as an electric type, tingling, or burning pain. It comes and goes. She has some numbness in her left foot. She has seen podiatry in the past who told her her pulses were fine. She denies weakness, imbalance, or falls.   Patient takes B12 for about 1 year, but is unsure how much she is on. This has helped a little with the pain. She will soak her feet and this also can help.   She does not report any constitutional symptoms like fever, night sweats, anorexia or unintentional weight loss.   EtOH use: None  Restrictive diet? Vegetarian Family history of neuropathy/myopathy/NM disease? no   She has never had an EMG and never been given medications for her symptoms. She does not think her symptoms are bad enough to warrant medication.  Most recent Assessment and Plan (05/15/22): Sherry Wilkinson is a 77 y.o. female who presents for evaluation of pain and numbness in bilateral feet. She has a relevant medical history of pre-diabetes, osteoporosis, and B12 deficiency. Her neurological examination is pertinent for mildly reduced sensation to vibration in bilateral great toes. Available diagnostic data is significant for B12 in 2019 of < 150 and HbA1c of 5.9. 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 Her known risk factors are pre-diabetes and B12 deficiency. I will send labs for other treatable causes. We discussed EMG, but patient deferred as she does not like needles.   PLAN: -Blood work: B1, B12, MMA, IFE -EMG deferred after discussion with patient -Lidocaine cream PRN -Alpha  lipoic acid 600 mg daily  Since their last visit: Labs showed no significant abnormalities, but B12 was borderline (356). Given her previous history of B12 deficiency, I recommended supplementation with B12 1000 mcg daily. She states her symptoms have improved. She denies any numbness and tingling in her feet. She denies significant imbalance and no falls.  She has no new complaints.   MEDICATIONS:  Outpatient Encounter Medications as of 11/15/2022  Medication Sig   Cholecalciferol (VITAMIN D3) 75 MCG (3000 UT) TABS Take 1 tablet by mouth.   ciclopirox (PENLAC) 8 % solution Apply topically at bedtime. Apply over nail and surrounding skin. Apply daily over previous coat. After seven (7) days, may remove with alcohol and continue cycle.   ferrous sulfate 325 (65 FE) MG EC tablet Take 325 mg by mouth every morning.   vitamin B-12 (CYANOCOBALAMIN) 500 MCG tablet Take 500 mcg by mouth daily.   vitamin C (ASCORBIC ACID) 250 MG tablet Take 250 mg by mouth daily.   No facility-administered encounter medications on file as of 11/15/2022.    PAST MEDICAL HISTORY: Past Medical History:  Diagnosis Date   Anemia    Appendicitis    Arthritis    all over - feet and knees especially    Cataract    Liver cyst    Osteopenia    Paresthesia 10/10/2017   Perineal abscess 12/16/2014    PAST SURGICAL HISTORY: Past Surgical History:  Procedure Laterality Date   APPENDECTOMY  1970   COLONOSCOPY  2018   COLONOSCOPY WITH PROPOFOL  07/25/2020   Dr.Pyrtle   POLYPECTOMY     UTERINE FIBROID SURGERY     WRIST SURGERY      ALLERGIES: No Known Allergies  FAMILY HISTORY: Family History  Problem Relation Age of Onset   Heart disease Mother    Heart disease Maternal Grandmother    Colon cancer Neg Hx    Esophageal cancer Neg Hx    Rectal cancer Neg Hx    Stomach cancer Neg Hx    Colon polyps Neg Hx     SOCIAL HISTORY: Social History   Tobacco Use   Smoking status: Former    Current  packs/day: 0.00    Types: Cigarettes    Quit date: 08/21/1998    Years since quitting: 24.2   Smokeless tobacco: Never  Vaping Use   Vaping status: Never Used  Substance Use Topics   Alcohol use: No   Drug use: No   Social History   Social History Narrative   Are you right handed or left handed? right   Are you currently employed ?    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 yes   Who lives with you?    What type of home do you live in: 1 story or 2 story? one    No caffeine    Objective:  Vital Signs:  BP (!) 145/82   Pulse 60   Ht 5' 1.5" (1.562 m)   Wt 139 lb (63 kg)   SpO2 99%   BMI 25.84 kg/m    General: No acute distress.  Patient appears well-groomed.   Head:  Normocephalic/atraumatic Neck: supple, no paraspinal tenderness, full range of motion Lungs:  Non-labored breathing on room air  Neurological Exam: alert and oriented.  Speech fluent and not dysarthric, language intact.  CN II-XII intact. Bulk and tone normal, muscle strength 5/5 throughout.  Sensation to light touch intact.  Deep tendon reflexes 2+ throughout.  Finger to nose testing intact.  Gait normal, Romberg negative.   Lab and Test Review: New results: 05/15/22: B12:356 MMA wnl B1: 8 IFE: no M protein  06/07/22: TSH wnl Lipid panel: Total cholesterol 189, LDL 100  08/22/22: HbA1c: 5.7  Previously reviewed results: 01/22/22: A1c: 5.9 Vit D: 37.1 BMP unremarkable   TSH (02/02/21): 3.54 B12 (10/10/17): < 150  ASSESSMENT: This is Sherry Wilkinson, a 77 y.o. female who originally saw me for numbness and tingling in her feet. Her B12 was borderline low, so supplementation was started. She now has no further numbness or tingling in her feet (symptoms have resolved).  Plan: -Continue B12 1000 mcg daily  Return to clinic as needed.   Jacquelyne Balint, MD

## 2022-11-15 ENCOUNTER — Encounter: Payer: Self-pay | Admitting: Neurology

## 2022-11-15 ENCOUNTER — Ambulatory Visit (INDEPENDENT_AMBULATORY_CARE_PROVIDER_SITE_OTHER): Payer: Medicare Other | Admitting: Neurology

## 2022-11-15 VITALS — BP 140/82 | HR 60 | Ht 61.5 in | Wt 139.0 lb

## 2022-11-15 DIAGNOSIS — M79672 Pain in left foot: Secondary | ICD-10-CM | POA: Diagnosis not present

## 2022-11-15 DIAGNOSIS — M79671 Pain in right foot: Secondary | ICD-10-CM | POA: Diagnosis not present

## 2022-11-15 DIAGNOSIS — E538 Deficiency of other specified B group vitamins: Secondary | ICD-10-CM

## 2022-11-15 NOTE — Patient Instructions (Signed)
Continue your B12 1000 mcg daily.  Follow up with me as needed.  The physicians and staff at [facility] are committed to providing excellent care. You may receive a survey requesting feedback about your experience at our office. We strive to receive "very good" responses to the survey questions. If you feel that your experience would prevent you from giving the office a "very good " response, please contact our office to try to remedy the situation. We may be reached at [phone]. Thank you for taking the time out of your busy day to complete the survey.  Jacquelyne Balint, MD [facility]

## 2022-12-06 ENCOUNTER — Ambulatory Visit: Payer: Medicare Other

## 2022-12-20 ENCOUNTER — Ambulatory Visit
Admission: RE | Admit: 2022-12-20 | Discharge: 2022-12-20 | Disposition: A | Discharge status: Home / Self Care | Payer: Medicare Other | Source: Ambulatory Visit | Attending: Family Medicine | Admitting: Family Medicine

## 2022-12-20 DIAGNOSIS — Z1231 Encounter for screening mammogram for malignant neoplasm of breast: Secondary | ICD-10-CM

## 2022-12-24 ENCOUNTER — Encounter: Payer: Self-pay | Admitting: Family Medicine

## 2023-02-21 ENCOUNTER — Encounter: Payer: Self-pay | Admitting: Family Medicine

## 2023-02-21 ENCOUNTER — Ambulatory Visit (INDEPENDENT_AMBULATORY_CARE_PROVIDER_SITE_OTHER): Payer: Medicare Other | Admitting: Family Medicine

## 2023-02-21 VITALS — BP 137/80 | HR 66 | Ht 61.5 in | Wt 139.8 lb

## 2023-02-21 DIAGNOSIS — R202 Paresthesia of skin: Secondary | ICD-10-CM | POA: Diagnosis not present

## 2023-02-21 DIAGNOSIS — E569 Vitamin deficiency, unspecified: Secondary | ICD-10-CM | POA: Diagnosis not present

## 2023-02-21 DIAGNOSIS — E611 Iron deficiency: Secondary | ICD-10-CM | POA: Diagnosis not present

## 2023-02-21 DIAGNOSIS — R7303 Prediabetes: Secondary | ICD-10-CM | POA: Diagnosis not present

## 2023-02-21 LAB — POCT GLYCOSYLATED HEMOGLOBIN (HGB A1C): Hemoglobin A1C: 5.4 % (ref 4.0–5.6)

## 2023-02-21 NOTE — Progress Notes (Signed)
    SUBJECTIVE:   CHIEF COMPLAINT / HPI:   Paresthesia: This is a chronic problem that waxes and wanes. Last week, she had a severe sensation which is undescribable in her right toe, which lasted hours. This brought her to tears. Sometimes, she feels numb but can't clarify if there is a pin and needle sensation.   HM: Colon cancer screening and influenza vaccination update are needed.  PreDM2: Diet controlled. Here for follow up.  PERTINENT  PMH / PSH: PMHx reviewed  OBJECTIVE:   BP 137/80   Pulse 66   Ht 5' 1.5" (1.562 m)   Wt 139 lb 12.8 oz (63.4 kg)   SpO2 99%   BMI 25.99 kg/m   Physical Exam Vitals and nursing note reviewed.  Cardiovascular:     Rate and Rhythm: Normal rate and regular rhythm.     Pulses:          Dorsalis pedis pulses are 2+ on the right side and 2+ on the left side.     Heart sounds: Normal heart sounds. No murmur heard. Pulmonary:     Effort: Pulmonary effort is normal. No respiratory distress.     Breath sounds: Normal breath sounds. No wheezing.  Musculoskeletal:     Comments: Sensory exam of the foot is normal, tested with the monofilament. Good pulses, no lesions or ulcers, good peripheral pulses.       ASSESSMENT/PLAN:   Paresthesia Etiology unclear Per the patient, she had normal EMG with the neurologist She wants to check her iron panel to see if this is contributory to her symptoms.  Normal toes exam today with no sensory loss Anemia panel, Vit B12 and thiamine checked Unable to order Vit D due to coverage waver request given no previous Vit D deficiency and no qualifying diagnosis Continue nutrient supplement I will call her with her test results Consider Gabapentin in the future discussed  Prediabetes A1C improved to normal range today May check A1C yearly or as needed She agreed with the plan    She declined all vaccinations. She said GI will contact her for her Colonoscopy. She said she does not need referral.  Janit Pagan, MD [facility]

## 2023-02-21 NOTE — Assessment & Plan Note (Signed)
A1C improved to normal range today May check A1C yearly or as needed She agreed with the plan

## 2023-02-21 NOTE — Assessment & Plan Note (Signed)
Etiology unclear Per the patient, she had normal EMG with the neurologist She wants to check her iron panel to see if this is contributory to her symptoms.  Normal toes exam today with no sensory loss Anemia panel, Vit B12 and thiamine checked Unable to order Vit D due to coverage waver request given no previous Vit D deficiency and no qualifying diagnosis Continue nutrient supplement I will call her with her test results Consider Gabapentin in the future discussed

## 2023-02-21 NOTE — Patient Instructions (Signed)
Paresthesia Paresthesia is a burning or prickling feeling. This feeling can happen in any part of the body. It often happens in the hands, arms, legs, or feet. Usually, it is not painful. In most cases, the feeling goes away in a short time and is not a sign of a serious problem. If you have paresthesia that lasts a long time, you need to see your doctor. Follow these instructions at home: Nutrition Eat a healthy diet. This includes: Eating foods that are high in fiber. These include beans, whole grains, and fresh fruits and vegetables. Limiting foods that are high in fat and sugar. These include fried or sweet foods.  Alcohol use  Do not drink alcohol if: Your doctor tells you not to drink. You are pregnant, may be pregnant, or are planning to become pregnant. If you drink alcohol: Limit how much you have to: 0-1 drink a day for women. 0-2 drinks a day for men. Know how much alcohol is in your drink. In the [place] one drink equals one 12 oz bottle of beer (355 mL), one 5 oz glass of wine (148 mL), or one 1 oz glass of hard liquor (44 mL). General instructions Take over-the-counter and prescription medicines only as told by your doctor. Do not smoke or use any products that contain nicotine or tobacco. If you need help quitting, ask your doctor. If you have diabetes, work with your doctor to make sure your blood sugar stays in a healthy range. If your feet feel numb: Check for redness, warmth, and swelling every day. Wear padded socks and comfortable shoes. These help protect your feet. Keep all follow-up visits. Contact a doctor if: You have paresthesia that gets worse or does not go away. You lose feeling (have numbness) after an injury. Your burning or prickling feeling gets worse when you walk. You have pain or cramps. You feel dizzy or you faint. You have a rash. Get help right away if: You feel weak or have new weakness in an arm or leg. You have trouble walking or  moving. You have problems speaking, understanding, or seeing. You feel confused. You cannot control when you pee (urinate) or poop (have a bowel movement). These symptoms may be an emergency. Get help right away. Call 911. Do not wait to see if the symptoms will go away. Do not drive yourself to the hospital. Summary Paresthesia is a burning or prickling feeling. It often happens in the hands, arms, legs, or feet. In most cases, the feeling goes away in a short time and is not a sign of a serious problem. If you have paresthesia that lasts a long time, you need to be seen by your doctor. This information is not intended to replace advice given to you by your health care provider. Make sure you discuss any questions you have with your health care provider. Document Revised: 12/18/2020 Document Reviewed: 12/18/2020 Elsevier Patient Education  2024 Elsevier Inc.  

## 2023-02-24 ENCOUNTER — Telehealth: Payer: Self-pay | Admitting: Family Medicine

## 2023-02-24 NOTE — Telephone Encounter (Signed)
Patient LVM on nurse line returning PCP phone call.   I attempted to call her back to discuss results, however no answer.

## 2023-02-24 NOTE — Telephone Encounter (Signed)
HIPAA compliant callback message left.  Please advise her when she calls.   Please advise lab test looks good. Iron level slightly elevated due to her oral iron supplement.She can take Iron supplement every other day instead of daily.  Thiamine level is still pending.

## 2023-02-25 NOTE — Telephone Encounter (Signed)
Patient returns call to nurse line.   Verified name and DOB. Provided with message per Dr. Lum Babe.   Patient is requesting letter with lab results once all results are back.   Veronda Prude, RN

## 2023-03-03 ENCOUNTER — Encounter: Payer: Self-pay | Admitting: Family Medicine

## 2023-03-03 LAB — ANEMIA PROFILE B
Basophils Absolute: 0 10*3/uL (ref 0.0–0.2)
Basos: 1 %
EOS (ABSOLUTE): 0.1 10*3/uL (ref 0.0–0.4)
Eos: 1 %
Ferritin: 26 ng/mL (ref 15–150)
Folate: 18.3 ng/mL (ref >3.0)
Hematocrit: 40 % (ref 34.0–46.6)
Hemoglobin: 12.8 g/dL (ref 11.1–15.9)
Immature Grans (Abs): 0 10*3/uL (ref 0.0–0.1)
Immature Granulocytes: 0 %
Iron Saturation: 50 % (ref 15–55)
Iron: 174 ug/dL — ABNORMAL HIGH (ref 27–139)
Lymphocytes Absolute: 2.2 10*3/uL (ref 0.7–3.1)
Lymphs: 37 %
MCH: 29.7 pg (ref 26.6–33.0)
MCHC: 32 g/dL (ref 31.5–35.7)
MCV: 93 fL (ref 79–97)
Monocytes Absolute: 0.5 10*3/uL (ref 0.1–0.9)
Monocytes: 9 %
Neutrophils Absolute: 3.1 10*3/uL (ref 1.4–7.0)
Neutrophils: 52 %
Platelets: 315 10*3/uL (ref 150–450)
RBC: 4.31 x10E6/uL (ref 3.77–5.28)
RDW: 13.3 % (ref 11.7–15.4)
Retic Ct Pct: 1 % (ref 0.6–2.6)
Total Iron Binding Capacity: 346 ug/dL (ref 250–450)
UIBC: 172 ug/dL (ref 118–369)
Vitamin B-12: 583 pg/mL (ref 232–1245)
WBC: 5.9 10*3/uL (ref 3.4–10.8)

## 2023-03-03 LAB — VITAMIN B1: Thiamine: 84.7 nmol/L (ref 66.5–200.0)

## 2023-03-06 DIAGNOSIS — H52223 Regular astigmatism, bilateral: Secondary | ICD-10-CM | POA: Diagnosis not present

## 2023-03-06 DIAGNOSIS — H524 Presbyopia: Secondary | ICD-10-CM | POA: Diagnosis not present

## 2023-03-06 DIAGNOSIS — H2513 Age-related nuclear cataract, bilateral: Secondary | ICD-10-CM | POA: Diagnosis not present

## 2023-05-02 ENCOUNTER — Encounter: Payer: Self-pay | Admitting: Family Medicine

## 2023-05-02 ENCOUNTER — Ambulatory Visit (INDEPENDENT_AMBULATORY_CARE_PROVIDER_SITE_OTHER): Payer: Medicare Other | Admitting: Family Medicine

## 2023-05-02 VITALS — BP 135/74 | HR 70 | Ht 61.5 in | Wt 136.2 lb

## 2023-05-02 DIAGNOSIS — R1030 Lower abdominal pain, unspecified: Secondary | ICD-10-CM

## 2023-05-02 DIAGNOSIS — Z1211 Encounter for screening for malignant neoplasm of colon: Secondary | ICD-10-CM

## 2023-05-02 DIAGNOSIS — R109 Unspecified abdominal pain: Secondary | ICD-10-CM | POA: Insufficient documentation

## 2023-05-02 NOTE — Assessment & Plan Note (Signed)
 Benign physical exam Likely constipation-related Pain is essentially now resolved Miralax recommended as I am unfamiliar with her herbal meds She declined Miralax for now as she is responding well to her herbs I placed GI referral for colon cancer screening - per documentation and the patient, she was meant to repeat screening in a year F/U soon if her pain reoccurs We will consider imaging then She agreed with the plan

## 2023-05-02 NOTE — Progress Notes (Signed)
    SUBJECTIVE:   CHIEF COMPLAINT / HPI:   Abdominal Pain This is a new (C/O intense lower abdominal pain, which she experienced 1 week ago. Prior to that, she was severely constipated x 2 weeks) problem. The onset quality is gradual. The problem has been rapidly improving (She took some herbal constipation medication and has been moving her bowel regularly since then. This helped improve her pain.). Pain scale: She denies any pain today. Associated symptoms include constipation. Pertinent negatives include no anorexia, diarrhea, dysuria, fever, frequency, hematuria, melena, nausea or vomiting. Nothing aggravates the pain. Relieved by: OTC herbal meds for constipation. The treatment provided significant relief.     PERTINENT  PMH / PSH: PMHx reviewed  OBJECTIVE:   BP 135/74   Pulse 70   Ht 5' 1.5 (1.562 m)   Wt 136 lb 3.2 oz (61.8 kg)   SpO2 100%   BMI 25.32 kg/m   Physical Exam Vitals and nursing note reviewed.  Constitutional:      Appearance: She is not ill-appearing.  Cardiovascular:     Rate and Rhythm: Normal rate and regular rhythm.     Heart sounds: Normal heart sounds. No murmur heard. Pulmonary:     Effort: Pulmonary effort is normal. No respiratory distress.     Breath sounds: Normal breath sounds.  Abdominal:     General: Abdomen is flat. Bowel sounds are normal. There is no distension.     Palpations: There is no mass.     Tenderness: There is no abdominal tenderness. There is no guarding.     Hernia: No hernia is present.  Neurological:     Mental Status: She is alert.      ASSESSMENT/PLAN:   Abdominal pain Benign physical exam Likely constipation-related Pain is essentially now resolved Miralax recommended as I am unfamiliar with her herbal meds She declined Miralax for now as she is responding well to her herbs I placed GI referral for colon cancer screening - per documentation and the patient, she was meant to repeat screening in a year F/U soon  if her pain reoccurs We will consider imaging then She agreed with the plan     Otto Fairly, MD [facility]

## 2023-05-02 NOTE — Patient Instructions (Signed)

## 2023-06-04 ENCOUNTER — Encounter: Payer: Medicare Other | Admitting: Family Medicine

## 2023-06-13 ENCOUNTER — Encounter: Payer: Self-pay | Admitting: Family Medicine

## 2023-07-28 ENCOUNTER — Ambulatory Visit (INDEPENDENT_AMBULATORY_CARE_PROVIDER_SITE_OTHER)

## 2023-07-28 VITALS — Ht 61.5 in | Wt 130.0 lb

## 2023-07-28 DIAGNOSIS — Z0000 Encounter for general adult medical examination without abnormal findings: Secondary | ICD-10-CM | POA: Diagnosis not present

## 2023-07-28 NOTE — Patient Instructions (Addendum)
 Sherry Wilkinson , Thank you for taking time to come for your Medicare Wellness Visit. I appreciate your ongoing commitment to your health goals. Please review the following plan we discussed and let me know if I can assist you in the future.   Referrals/Orders/Follow-Ups/Clinician Recommendations: Keep maintaining your health by keeping your appointments with Dr. Lum Babe and any specialists that you may see.  Call us if you need anything.  Have a great year!!!!  This is a list of the screening recommended for you and due dates:  Health Maintenance  Topic Date Due   Colon Cancer Screening  12/01/2022   DTaP/Tdap/Td vaccine (1 - Tdap) 02/21/2024*   Pneumonia Vaccine (1 of 1 - PCV) 02/21/2024*   Flu Shot  11/21/2023   Medicare Annual Wellness Visit  07/27/2024   DEXA scan (bone density measurement)  Completed   Hepatitis C Screening  Completed   HPV Vaccine  Aged Out   COVID-19 Vaccine  Discontinued   Zoster (Shingles) Vaccine  Discontinued  *Topic was postponed. The date shown is not the original due date.    Advanced directives: (In Chart) A copy of your advanced directives are scanned into your chart should your provider ever need it.  Next Medicare Annual Wellness Visit scheduled for next year: Yes

## 2023-07-28 NOTE — Progress Notes (Signed)
 Because this visit was a virtual/telehealth visit,  certain criteria was not obtained, such a blood pressure, CBG if applicable, and timed get up and go. Any medications not marked as "taking" were not mentioned during the medication reconciliation part of the visit. Any vitals not documented were not able to be obtained due to this being a telehealth visit or patient was unable to self-report a recent blood pressure reading due to a lack of equipment at home via telehealth. Vitals that have been documented are verbally provided by the patient.   Subjective:   Sherry Wilkinson is a 78 y.o. who presents for a Medicare Wellness preventive visit.  Visit Complete: Virtual I connected with  Sherry Wilkinson on 07/28/23 by a audio enabled telemedicine application and verified that I am speaking with the correct person using two identifiers.  Patient Location: Home  Provider Location: Office/Clinic  I discussed the limitations of evaluation and management by telemedicine. The patient expressed understanding and agreed to proceed.  Vital Signs: Because this visit was a virtual/telehealth visit, some criteria may be missing or patient reported. Any vitals not documented were not able to be obtained and vitals that have been documented are patient reported.  VideoDeclined- This patient declined Librarian, academic. Therefore the visit was completed with audio only.  Persons Participating in Visit: Patient.  AWV Questionnaire: No: Patient Medicare AWV questionnaire was not completed prior to this visit.  Cardiac Risk Factors include: advanced age (>90men, >67 women);dyslipidemia;family history of premature cardiovascular disease     Objective:    Today's Vitals   07/28/23 1153  Weight: 130 lb (59 kg)  Height: 5' 1.5" (1.562 m)  PainSc: 0-No pain   Body mass index is 24.17 kg/m.     07/28/2023   11:55 AM 05/02/2023    8:59 AM 02/21/2023   10:00 AM 11/15/2022     8:46 AM 08/21/2022    2:55 PM 06/03/2022    9:25 AM 05/15/2022    9:09 AM  Advanced Directives  Does Patient Have a Medical Advance Directive? Yes No No Yes No Yes Yes  Type of Estate agent of [place];Living will   Living will;Healthcare Power of [employer] Power of [place];Living will Living will  Does patient want to make changes to medical advance directive? No - Patient declined     No - Patient declined   Copy of Healthcare Power of Attorney in Chart? Yes - validated most recent copy scanned in chart (See row information)     No - copy requested   Would patient like information on creating a medical advance directive?  No - Patient declined   No - Patient declined      Current Medications (verified) Outpatient Encounter Medications as of 07/28/2023  Medication Sig   Cholecalciferol (VITAMIN D3) 75 MCG (3000 UT) TABS Take 1 tablet by mouth.   ferrous sulfate 325 (65 FE) MG EC tablet Take 325 mg by mouth every morning.   vitamin B-12 (CYANOCOBALAMIN) 500 MCG tablet Take 500 mcg by mouth daily.   vitamin C (ASCORBIC ACID) 250 MG tablet Take 250 mg by mouth daily.   No facility-administered encounter medications on file as of 07/28/2023.    Allergies (verified) Patient has no known allergies.   History: Past Medical History:  Diagnosis Date   Anemia    Appendicitis    Arthritis    all over - feet and knees especially    Cataract  Liver cyst    Osteopenia    Paresthesia 10/10/2017   Perineal abscess 12/16/2014   Past Surgical History:  Procedure Laterality Date   APPENDECTOMY  1970   COLONOSCOPY  2018   COLONOSCOPY WITH PROPOFOL  07/25/2020   Dr.Pyrtle   POLYPECTOMY     UTERINE FIBROID SURGERY     WRIST SURGERY     Family History  Problem Relation Age of Onset   Heart disease Mother    Heart disease Maternal Grandmother    Colon cancer Neg Hx    Esophageal cancer Neg Hx    Rectal cancer Neg Hx    Stomach cancer Neg Hx    Colon  polyps Neg Hx    Social History   Socioeconomic History   Marital status: Divorced    Spouse name: Not on file   Number of children: 0   Years of education: Not on file   Highest education level: Not on file  Occupational History   Occupation: Retired  Tobacco Use   Smoking status: Former    Current packs/day: 0.00    Types: Cigarettes    Quit date: 08/21/1998    Years since quitting: 24.9   Smokeless tobacco: Never  Vaping Use   Vaping status: Never Used  Substance and Sexual Activity   Alcohol use: No   Drug use: No   Sexual activity: Never  Other Topics Concern   Not on file  Social History Narrative   Are you right handed or left handed? right   Are you currently employed ?    What is your current occupation? retired   Do you live at home alone? yes   Who lives with you?    What type of home do you live in: 1 story or 2 story? One story   No caffeine   Social Drivers of Corporate investment banker Strain: Low Risk  (07/28/2023)   Overall Financial Resource Strain (CARDIA)    Difficulty of Paying Living Expenses: Not hard at all  Food Insecurity: No Food Insecurity (07/28/2023)   Hunger Vital Sign    Worried About Running Out of Food in the Last Year: Never true    Ran Out of Food in the Last Year: Never true  Transportation Needs: No Transportation Needs (07/28/2023)   PRAPARE - Administrator, Civil Service (Medical): No    Lack of Transportation (Non-Medical): No  Physical Activity: Insufficiently Active (07/28/2023)   Exercise Vital Sign    Days of Exercise per Week: 7 days    Minutes of Exercise per Session: 20 min  Stress: No Stress Concern Present (07/28/2023)   [employer] of Occupational Health - Occupational Stress Questionnaire    Feeling of Stress : Not at all  Social Connections: Socially Isolated (07/28/2023)   Social Connection and Isolation Panel [NHANES]    Frequency of Communication with Friends and Family: Once a week    Frequency of  Social Gatherings with Friends and Family: Once a week    Attends Religious Services: More than 4 times per year    Active Member of [employer] or Organizations: No    Attends Banker Meetings: Never    Marital Status: Divorced    Tobacco Counseling Counseling given: Not Answered    Clinical Intake:  Pre-visit preparation completed: Yes  Pain : No/denies pain Pain Score: 0-No pain     BMI - recorded: 24.17 Nutritional Status: BMI of 19-24  Normal Nutritional Risks: None  Diabetes: No  Lab Results  Component Value Date   HGBA1C 5.4 02/21/2023   HGBA1C 5.7 (H) 08/22/2022   HGBA1C 5.9 (H) 01/22/2022     How often do you need to have someone help you when you read instructions, pamphlets, or other written materials from your doctor or pharmacy?: 1 - Never  Interpreter Needed?: No  Information entered by :: Raylinn Kosar N. Joscelyne Renville, LPN.   Activities of Daily Living     07/28/2023   11:57 AM  In your present state of health, do you have any difficulty performing the following activities:  Hearing? 1  Comment right ear  Vision? 0  Difficulty concentrating or making decisions? 0  Walking or climbing stairs? 0  Dressing or bathing? 0  Doing errands, shopping? 0  Preparing Food and eating ? N  Using the Toilet? N  In the past six months, have you accidently leaked urine? N  Do you have problems with loss of bowel control? N  Managing your Medications? N  Managing your Finances? N  Housekeeping or managing your Housekeeping? N    Patient Care Team: Doreene Eland, MD as PCP - General (Family Medicine) Illene Labrador, OD as Consulting Physician (Optometry) Pyrtle, Carie Caddy, MD as Consulting Physician (Gastroenterology) Antony Madura, MD as Consulting Physician (Neurology)  Indicate any recent Medical Services you may have received from other than [facility] providers in the past year (date may be approximate).     Assessment:   This is a routine wellness  examination for Sherry Wilkinson.  Hearing/Vision screen Hearing Screening - Comments:: Denies hearing difficulties in right ear. No hearing aids.  Vision Screening - Comments:: Wears reading glasses - up to date with routine eye exams with Illene Labrador, OD.    Goals Addressed             This Visit's Progress    "Stay as I am and be as I am"         Depression Screen     07/28/2023   11:57 AM 05/02/2023    9:01 AM 02/21/2023   10:02 AM 08/21/2022    2:55 PM 06/07/2022    9:08 AM 06/03/2022    9:15 AM 01/22/2022   10:02 AM  PHQ 2/9 Scores  PHQ - 2 Score 0 0  0 0 0   PHQ- 9 Score 0 0  2 0    Exception Documentation   Patient refusal    Patient refusal    Fall Risk     07/28/2023   11:56 AM 05/02/2023    8:58 AM 02/21/2023   10:00 AM 11/15/2022    8:45 AM 08/21/2022    2:55 PM  Fall Risk   Falls in the past year? 0 0 0 0 0  Number falls in past yr: 0 0 0 0 0  Injury with Fall? 0 0 0 0 0  Risk for fall due to : No Fall Risks      Follow up Falls prevention discussed;Falls evaluation completed   Falls evaluation completed     MEDICARE RISK AT HOME:  Medicare Risk at Home Any stairs in or around the home?: No If so, are there any without handrails?: No Home free of loose throw rugs in walkways, pet beds, electrical cords, etc?: Yes Adequate lighting in your home to reduce risk of falls?: Yes Life alert?: No Use of a cane, walker or w/c?: No Grab bars in the bathroom?: Yes Shower chair or bench in shower?:  No Elevated toilet seat or a handicapped toilet?: No  TIMED UP AND GO:  Was the test performed?  No  Cognitive Function: 6CIT completed    07/28/2023   11:59 AM  MMSE - Mini Mental State Exam  Not completed: Unable to complete        07/28/2023   12:00 PM 06/03/2022    9:16 AM  6CIT Screen  What Year? 0 points 0 points  What month? 0 points 0 points  What time? 0 points 0 points  Count back from 20 0 points 0 points  Months in reverse 0 points 0 points  Repeat  phrase 0 points 0 points  Total Score 0 points 0 points    Immunizations  There is no immunization history on file for this patient.  Screening Tests Health Maintenance  Topic Date Due   Colonoscopy  12/01/2022   DTaP/Tdap/Td (1 - Tdap) 02/21/2024 (Originally 04/09/1965)   Pneumonia Vaccine 62+ Years old (1 of 1 - PCV) 02/21/2024 (Originally 04/09/2010)   INFLUENZA VACCINE  11/21/2023   Medicare Annual Wellness (AWV)  07/27/2024   DEXA SCAN  Completed   Hepatitis C Screening  Completed   HPV VACCINES  Aged Out   COVID-19 Vaccine  Discontinued   Zoster Vaccines- Shingrix  Discontinued    Health Maintenance  Health Maintenance Due  Topic Date Due   Colonoscopy  12/01/2022   Health Maintenance Items Addressed: Yes, Patient is scheduled to see [facility] for abdominal pain and discuss if colonoscopy is needed.  Additional Screening:  Vision Screening: Recommended annual ophthalmology exams for early detection of glaucoma and other disorders of the eye.  Dental Screening: Recommended annual dental exams for proper oral hygiene  Community Resource Referral / Chronic Care Management: CRR required this visit?  No   CCM required this visit?  No     Plan:     I have personally reviewed and noted the following in the patient's chart:   Medical and social history Use of alcohol, tobacco or illicit drugs  Current medications and supplements including opioid prescriptions. Patient is not currently taking opioid prescriptions. Functional ability and status Nutritional status Physical activity Advanced directives List of other physicians Hospitalizations, surgeries, and ER visits in previous 12 months Vitals Screenings to include cognitive, depression, and falls Referrals and appointments  In addition, I have reviewed and discussed with patient certain preventive protocols, quality metrics, and best practice recommendations. A written personalized care plan for  preventive services as well as general preventive health recommendations were provided to patient.     Mickeal Needy, LPN   4/0/9811   After Visit Summary: (Declined) Due to this being a telephonic visit, with patients personalized plan was offered to patient but patient Declined AVS at this time   Notes: Please refer to Routing Comments.

## 2023-07-31 ENCOUNTER — Encounter: Payer: Self-pay | Admitting: Nurse Practitioner

## 2023-07-31 ENCOUNTER — Ambulatory Visit: Admitting: Nurse Practitioner

## 2023-07-31 VITALS — BP 128/80 | HR 68 | Ht 59.5 in | Wt 137.1 lb

## 2023-07-31 DIAGNOSIS — K5900 Constipation, unspecified: Secondary | ICD-10-CM

## 2023-07-31 DIAGNOSIS — Z860101 Personal history of adenomatous and serrated colon polyps: Secondary | ICD-10-CM

## 2023-07-31 DIAGNOSIS — R1084 Generalized abdominal pain: Secondary | ICD-10-CM | POA: Diagnosis not present

## 2023-07-31 DIAGNOSIS — R1030 Lower abdominal pain, unspecified: Secondary | ICD-10-CM

## 2023-07-31 NOTE — Progress Notes (Signed)
 07/31/2023 Sherry Wilkinson [id number] April 27, 1945   Chief Complaint: Abdominal pain, constipation   History of Present Illness: Sherry Wilkinson is a 78 year old female with a past medical history of osteoporosis, hyperlipidemia, prediabetes, vitamin B12 deficiency and colon polyps.  She is known by Dr. Rhea Belton.  She presents today for further evaluation regarding constipation and abdominal pain.  She described taking 1 tab of Goldenseal to strengthen intestines 05/2023 which resulted in constipation, no BM x 2 days with generalized abdominal pain that was so bad she could not walk for 48 hours and stayed home.  By the third day, her abdominal pain abated and she resumed her normal bowel pattern of passing a formed stool daily.  No further bouts of constipation or abdominal pain.  No bloody stools.  Her appetite is good.  No weight loss.  Her most recent colonoscopy was 11/30/2021 which identified 7 polyps measuring 3 to 10 mm removed from the ascending, transverse, descending and sigmoid colon.  Path report identified tubular adenomatous and hyperplastic polyps.  She was advised to repeat a colonoscopy in 3 years.     Latest Ref Rng & Units 02/21/2023   10:30 AM 08/22/2022   11:57 AM 06/07/2022    9:50 AM  CBC  WBC 3.4 - 10.8 x10E3/uL 5.9  5.8  7.9   Hemoglobin 11.1 - 15.9 g/dL 40.9  81.1  91.4   Hematocrit 34.0 - 46.6 % 40.0  38.5  40.3   Platelets 150 - 450 x10E3/uL 315  294  324        Latest Ref Rng & Units 08/22/2022   11:57 AM 06/07/2022    9:50 AM 01/22/2022   11:12 AM  CMP  Glucose 70 - 99 mg/dL 90  94  87   BUN 8 - 27 mg/dL 9  11  9    Creatinine 0.57 - 1.00 mg/dL 7.82  9.56  2.13   Sodium 134 - 144 mmol/L 142  143  143   Potassium 3.5 - 5.2 mmol/L 4.2  4.3  4.0   Chloride 96 - 106 mmol/L 106  105  104   CO2 20 - 29 mmol/L 19  23  23    Calcium 8.7 - 10.3 mg/dL 9.4  9.8  9.6   Total Protein 6.0 - 8.5 g/dL 6.5  6.7    Total Bilirubin 0.0 - 1.2 mg/dL 0.5  0.4    Alkaline  Phos 44 - 121 IU/L 102  105    AST 0 - 40 IU/L 21  21    ALT 0 - 32 IU/L 17  15      PAST GI PROCEDURES:.   Colonoscopy 11/30/2021: - Two 3 to 10 mm polyps in the ascending colon, removed with a cold snare. Resected and retrieved.  - Two 4 to 5 mm polyps in the transverse colon, removed with a cold snare. Resected and retrieved. - One 5 mm polyp in the descending colon, removed with a cold snare. Resected and retrieved.  - Two 3 to 4 mm polyps in the sigmoid colon, removed with a cold snare. Resected and retrieved. - Mild diverticulosis in the sigmoid colon. - Internal hemorrhoids. - 3 year recall colonoscopy  1. Surgical [P], colon, ascending, transverse, polyp (5) TUBULAR ADENOMAS AND A HYPERPLASTIC POLYP. NEGATIVE FOR HIGH-GRADE DYSPLASIA. 2. Surgical [P], colon, sigmoid and descending, polyp (3) TUBULAR ADENOMA AND HYPERPLASTIC POLYPS. NEGATIVE FOR HIGH-GRADE DYSPLASIA.  Colonoscopy 07/25/2020: - Preparation of the colon was poor  clearing to fair. Smaller lesions could have been missed. - Two 4 to 5 mm polyps in the transverse colon, removed with a cold snare. Complete resection. Partial retrieval. - Diverticulosis in the sigmoid colon and in the descending colon.  - Internal hemorrhoids.  Past Medical History:  Diagnosis Date   Anemia    Appendicitis    Arthritis    all over - feet and knees especially    Cataract    Liver cyst    Osteopenia    Paresthesia 10/10/2017   Perineal abscess 12/16/2014   Past Surgical History:  Procedure Laterality Date   APPENDECTOMY  1970   COLONOSCOPY  2018   COLONOSCOPY WITH PROPOFOL  07/25/2020   Dr.Pyrtle   POLYPECTOMY     UTERINE FIBROID SURGERY     WRIST SURGERY     Current Outpatient Medications on File Prior to Visit  Medication Sig Dispense Refill   Cholecalciferol (VITAMIN D3) 75 MCG (3000 UT) TABS Take 1 tablet by mouth.     ferrous sulfate 325 (65 FE) MG EC tablet Take 325 mg by mouth every morning.     vitamin B-12  (CYANOCOBALAMIN) 500 MCG tablet Take 500 mcg by mouth daily.     vitamin C (ASCORBIC ACID) 250 MG tablet Take 250 mg by mouth daily.     No current facility-administered medications on file prior to visit.   No Known Allergies  Current Medications, Allergies, Past Medical History, Past Surgical History, Family History and Social History were reviewed in [employer] record.  Review of Systems:   Constitutional: Negative for fever, sweats, chills or weight loss.  Respiratory: Negative for shortness of breath.   Cardiovascular: Negative for chest pain, palpitations and leg swelling.  Gastrointestinal: See HPI.  Musculoskeletal: Negative for back pain or muscle aches.  Neurological: Negative for dizziness, headaches or paresthesias.   Physical Exam: BP 128/80 (BP Location: Left Arm, Patient Position: Sitting, Cuff Size: Normal)   Pulse 68   Ht 4' 11.5" (1.511 m) Comment: height measured without shoes  Wt 137 lb 2 oz (62.2 kg)   BMI 27.23 kg/m   Wt Readings from Last 3 Encounters:  07/31/23 137 lb 2 oz (62.2 kg)  07/28/23 130 lb (59 kg)  05/02/23 136 lb 3.2 oz (61.8 kg)    General: 78 year old female in no acute distress. Head: Normocephalic and atraumatic. Eyes: No scleral icterus. Conjunctiva pink . Ears: Normal auditory acuity. Mouth: Dentition intact. No ulcers or lesions.  Lungs: Clear throughout to auscultation. Heart: Regular rate and rhythm, no murmur. Abdomen: Soft, nontender and nondistended. No masses or hepatomegaly. Normal bowel sounds x 4 quadrants.  Rectal: Deferred.  Musculoskeletal: Symmetrical with no gross deformities. Extremities: No edema. Neurological: Alert oriented x 4. No focal deficits.  Psychological: Alert and cooperative. Normal mood and affect  Assessment and Recommendations:  78 year old female with acute constipation and generalized abdominal pain x 48 hours which occurred after she took Goldenseal one tab for digestive  health and abated by the third day without recurrence. - Avoid any further goldenseal use - Benefiber 1 tablespoon daily as tolerated - MiraLAX nightly as needed - Drink 6 to 8 glasses of water daily - Patient to contact office if abdominal pain or constipation recurs  History of colon polyps. Her most recent colonoscopy 11/30/2021 identified 7 tubular adenomatous/hyperplastic polyps removed from the colon.   - Next colonoscopy due 11/2024 if medically appropriate as the patient will be 78 years old at that time

## 2023-07-31 NOTE — Patient Instructions (Addendum)
 Next colonoscopy due August 2026.  Miralax- take every night at bedtime as tolerated to avoid constipation straining  Benefiber- take 1 tablespoon daily  Contact our office if abdominal pain recurs.  Drink 6-8 glasses of water daily  Due to recent changes in healthcare laws, you may see the results of your imaging and laboratory studies on MyChart before your provider has had a chance to review them.  We understand that in some cases there may be results that are confusing or concerning to you. Not all laboratory results come back in the same time frame and the provider may be waiting for multiple results in order to interpret others.  Please give [place] 48 hours in order for your provider to thoroughly review all the results before contacting the office for clarification of your results.   Thank you for trusting me with your gastrointestinal care!   Alcide Evener, CRNP

## 2023-08-24 NOTE — Progress Notes (Signed)
 Addendum: Reviewed and agree with assessment and management plan. Asha Grumbine, Carie Caddy, MD

## 2023-09-18 ENCOUNTER — Ambulatory Visit: Admitting: Family Medicine

## 2023-09-18 VITALS — BP 118/72 | HR 74 | Wt 135.4 lb

## 2023-09-18 DIAGNOSIS — R229 Localized swelling, mass and lump, unspecified: Secondary | ICD-10-CM | POA: Diagnosis not present

## 2023-09-18 NOTE — Progress Notes (Signed)
    SUBJECTIVE:   CHIEF COMPLAINT / HPI:   Discomfort/pain in groin In 2022, had inclusion cyst removed from mons pubis (right side)  Afterwards had sutures removed Since suture removal, has been having pain/discomfort in the area. Particularly worse over the past few weeks Does not feel any fluid collection or new cyst. Has not noticed redness, warmth, or drainage from the area No dysuria, hematuria, vaginal bleeding/discharge, abdominal pain, fevers. Not sexually active   PERTINENT  PMH / PSH: inclusion cyst removal 2022  OBJECTIVE:   BP 118/72   Pulse 74   Wt 135 lb 6.4 oz (61.4 kg)   SpO2 98%   BMI 26.89 kg/m   General: NAD, pleasant, able to participate in exam Respiratory: No respiratory distress Skin: Mons pubis: on right side, about 2cm lateral to vulva, visible scar from inclusion cyst removal in 2022. No overlying skin discoloration or erythema or warmth. Some palpable fibrous/scar tissue underneath her scar, possible nodularity about 0.5-1cm in size. No fluctuance. Mildly tender.   ASSESSMENT/PLAN:   Assessment & Plan Skin nodule Suspect underlying fibrous scar tissue from inclusion cyst removal 3 years ago in 2022. Given pain and palpable abnormality, will obtain soft tissue ultrasound of the area for further evaluation. In the meantime, recommend warm compresses and symptomatic control with OTC analgesics. F/u pending ultrasound read.   Edison Gore, MD [facility]

## 2023-09-18 NOTE — Patient Instructions (Signed)
 We will let you know what the results of the ultrasound show  In the meantime please continue to use warm compresses and baths as well as tylenol  for pain control  Please look out for signs of infection including redness, warmth, drainage, or severe pain/tenderness in the area

## 2023-09-30 ENCOUNTER — Ambulatory Visit (HOSPITAL_COMMUNITY)
Admission: RE | Admit: 2023-09-30 | Discharge: 2023-09-30 | Disposition: A | Discharge status: Home / Self Care | Source: Ambulatory Visit | Attending: Family Medicine | Admitting: Family Medicine

## 2023-09-30 ENCOUNTER — Other Ambulatory Visit: Payer: Self-pay | Admitting: Family Medicine

## 2023-09-30 DIAGNOSIS — R229 Localized swelling, mass and lump, unspecified: Secondary | ICD-10-CM | POA: Diagnosis not present

## 2023-09-30 DIAGNOSIS — R102 Pelvic and perineal pain: Secondary | ICD-10-CM | POA: Diagnosis not present

## 2023-10-06 ENCOUNTER — Ambulatory Visit: Payer: Self-pay | Admitting: Family Medicine

## 2023-10-06 NOTE — Progress Notes (Signed)
 Attempted to reach patient with both numbers in her chart. Patient did not answer. I was unable to leave her a voicemail.

## 2023-10-06 NOTE — Telephone Encounter (Signed)
 Attempts made to reach this patient.   Please advise abnormal pelvic US  needing additional evaluation or referral to a Gynecologist for excisional biopsy.  Dr. Grandville Lax

## 2023-10-08 ENCOUNTER — Encounter: Payer: Self-pay | Admitting: Family Medicine

## 2023-11-04 ENCOUNTER — Encounter: Payer: Self-pay | Admitting: Family Medicine

## 2023-11-04 ENCOUNTER — Ambulatory Visit (INDEPENDENT_AMBULATORY_CARE_PROVIDER_SITE_OTHER): Admitting: Family Medicine

## 2023-11-04 VITALS — BP 129/69 | HR 55 | Ht 59.5 in | Wt 135.2 lb

## 2023-11-04 DIAGNOSIS — R1030 Lower abdominal pain, unspecified: Secondary | ICD-10-CM

## 2023-11-04 DIAGNOSIS — R1909 Other intra-abdominal and pelvic swelling, mass and lump: Secondary | ICD-10-CM

## 2023-11-04 HISTORY — DX: Lower abdominal pain, unspecified: R10.30

## 2023-11-04 NOTE — Assessment & Plan Note (Addendum)
 Pelvic US  reviewed and discussed with her 1.9 x 1.0 x 1.7 cm ill-defined heterogeneously hypoechoic solid mass R/O malignancy vs fat necrosis, vs cyst, vs Phlegmon I doubt malignancy given the chronicity Option of repeating pelvic US  in 8 wks vs referral to Gyn for tissue diagnosis or both were offered She opted for Gyn referral for tissue diagnosis instead Referral order placed

## 2023-11-04 NOTE — Patient Instructions (Signed)
 It was nice seeing you today. We discussed your ultrasound result and decided going to see a Gynecologist is the next step. Referral order placed.

## 2023-11-04 NOTE — Progress Notes (Signed)
    SUBJECTIVE:   CHIEF COMPLAINT / HPI:   Groin nodule: Patient stated her groin discomfort has improved, but still present. Her to discuss US  report and management.  PERTINENT  PMH / PSH: PMHx reviewed  OBJECTIVE:   BP 129/69   Pulse (!) 55   Ht 4' 11.5 (1.511 m)   Wt 135 lb 3.2 oz (61.3 kg)   SpO2 97%   BMI 26.85 kg/m   Physical Exam Vitals and nursing note reviewed.  Cardiovascular:     Rate and Rhythm: Normal rate and regular rhythm.     Heart sounds: Normal heart sounds. No murmur heard. Pulmonary:     Effort: Pulmonary effort is normal. No respiratory distress.     Breath sounds: Normal breath sounds. No wheezing.  Abdominal:     General: Abdomen is flat. Bowel sounds are normal. There is no distension.     Palpations: Abdomen is soft. There is no mass.     Tenderness: There is no abdominal tenderness.  Genitourinary:    Comments: Deferred    Pelvic US  of her Mons pubis lesion: IMPRESSION: 1.9 x 1.0 x 1.7 cm ill-defined heterogeneously hypoechoic solid mass with scant color flow in the area of palpable concern.   I suspect this is probably fat necrosis because a cyst reportedly was recently removed in this area. A mass due to phlegmon or malignant process is not strictly excluded.   If tissue sampling is not planned, follow-up ultrasound or CT of the area in 8 weeks is suggested for reassessment as to resolution or improvement or for recharacterization.  ASSESSMENT/PLAN:   Assessment & Plan Discomfort of groin, unspecified laterality Pelvic US  reviewed and discussed with her 1.9 x 1.0 x 1.7 cm ill-defined heterogeneously hypoechoic solid mass R/O malignancy vs fat necrosis, vs cyst, vs Phlegmon I doubt malignancy given the chronicity Option of repeating pelvic US  in 8 wks vs referral to Gyn for tissue diagnosis or both were offered She opted for Gyn referral for tissue diagnosis instead Referral order placed  Groin mass in female Seen on pelvic  US  See above assessment and plan     Otto Fairly, MD [facility]

## 2023-11-06 ENCOUNTER — Telehealth: Payer: Self-pay

## 2023-11-06 NOTE — Telephone Encounter (Signed)
 Patient LVM on nurse line requesting a call back for biopsy apt.   Per office note, patients next steps are to see GYN.   Referral was placed on 7/15, however has not been processed yet.   Will forward to referral coordinator and PCP.

## 2023-11-11 ENCOUNTER — Other Ambulatory Visit: Payer: Self-pay | Admitting: Family Medicine

## 2023-11-11 DIAGNOSIS — Z1231 Encounter for screening mammogram for malignant neoplasm of breast: Secondary | ICD-10-CM

## 2023-11-27 ENCOUNTER — Ambulatory Visit: Admitting: Obstetrics & Gynecology

## 2023-11-27 ENCOUNTER — Encounter: Payer: Self-pay | Admitting: Obstetrics & Gynecology

## 2023-11-27 VITALS — BP 132/81 | HR 56 | Ht 60.0 in | Wt 138.0 lb

## 2023-11-27 DIAGNOSIS — R1031 Right lower quadrant pain: Secondary | ICD-10-CM | POA: Diagnosis not present

## 2023-11-27 DIAGNOSIS — L720 Epidermal cyst: Secondary | ICD-10-CM | POA: Diagnosis not present

## 2023-11-27 NOTE — Progress Notes (Signed)
 Patient ID: Sherry Wilkinson, female   DOB: [birth year], 78 y.o.   MRN: [id number]  Chief Complaint  Patient presents with   NEW PATIENT/GYN    HPI Sherry Wilkinson is a 78 y.o. female.  Patient has right groin pain and is referred by family medicine after US  showed subcutaneous 1.9 x 1.0 x 1.7 cm ill-defined heterogeneously hypoechoic solid mass with scant color flow in area of mons pubis. She has had pain there since an inclusion cyst was removed in 2022. HPI  Past Medical History:  Diagnosis Date   Anemia    Appendicitis    Arthritis    all over - feet and knees especially    Cataract    Liver cyst    Osteopenia    Paresthesia 10/10/2017   Perineal abscess 12/16/2014   Vaginal Pap smear, abnormal     Past Surgical History:  Procedure Laterality Date   APPENDECTOMY  1970   COLONOSCOPY  2018   COLONOSCOPY WITH PROPOFOL   07/25/2020   Dr.Pyrtle   POLYPECTOMY     UTERINE FIBROID SURGERY     WRIST SURGERY      Family History  Problem Relation Age of Onset   Heart disease Mother    Heart disease Maternal Grandmother    Colon cancer Neg Hx    Esophageal cancer Neg Hx    Rectal cancer Neg Hx    Stomach cancer Neg Hx    Colon polyps Neg Hx     Social History Social History   Tobacco Use   Smoking status: Former    Current packs/day: 0.00    Types: Cigarettes    Quit date: 08/21/1998    Years since quitting: 25.2   Smokeless tobacco: Never  Vaping Use   Vaping status: Never Used  Substance Use Topics   Alcohol use: No   Drug use: No    No Known Allergies  Current Outpatient Medications  Medication Sig Dispense Refill   Cholecalciferol (VITAMIN D3) 75 MCG (3000 UT) TABS Take 1 tablet by mouth.     ferrous sulfate 325 (65 FE) MG EC tablet Take 325 mg by mouth every morning.     vitamin B-12 (CYANOCOBALAMIN ) 500 MCG tablet Take 500 mcg by mouth daily.     vitamin C (ASCORBIC ACID) 250 MG tablet Take 250 mg by mouth daily.     No current  facility-administered medications for this visit.    Review of Systems Review of Systems  Constitutional: Negative.   Respiratory: Negative.    Cardiovascular: Negative.   Gastrointestinal: Negative.   Genitourinary:  Positive for pelvic pain.  Hematological:  Bruises/bleeds easily: right groin.    Blood pressure 132/81, pulse (!) 56, height 5' (1.524 m), weight 138 lb (62.6 kg).  Physical Exam Physical Exam Vitals and nursing note reviewed. Exam conducted with a chaperone present.  Constitutional:      Appearance: Normal appearance.  Abdominal:     General: Abdomen is flat.     Palpations: Abdomen is soft. There is no mass.     Comments: No mass or tenderness in groin or suprapubic  Neurological:     Mental Status: She is alert.  Psychiatric:        Mood and Affect: Mood normal.        Behavior: Behavior normal.     Data Reviewed 09/2023 US  1.9 x 1.0 x 1.7 cm ill-defined heterogeneously hypoechoic solid mass with scant color flow in the area of palpable concern.  Assessment Right groin pain no palpable mass in area indicated on US   Plan Orders Placed This Encounter  Procedures   CT ABDOMEN PELVIS W WO CONTRAST    Standing Status:   Future    Expiration Date:   11/26/2024    If indicated for the ordered procedure, I authorize the administration of contrast media per Radiology protocol:   Yes    Does the patient have a contrast media/X-ray dye allergy?:   No    Preferred imaging location?:   [facility]    If indicated for the ordered procedure, I authorize the administration of oral contrast media per Radiology protocol:   Yes   F/u after the CT is resulted, doubt any surgical management indicated    Sherry Wilkinson 11/27/2023, 8:56 AM

## 2023-11-27 NOTE — Progress Notes (Signed)
 78 y.o. New GYN presents for Follow up of Mass at the mons pubis area.  C/o pain 8/10 x 1 month

## 2023-12-23 ENCOUNTER — Ambulatory Visit
Admission: RE | Admit: 2023-12-23 | Discharge: 2023-12-23 | Disposition: A | Discharge status: Home / Self Care | Source: Ambulatory Visit | Attending: Family Medicine | Admitting: Family Medicine

## 2023-12-23 DIAGNOSIS — Z1231 Encounter for screening mammogram for malignant neoplasm of breast: Secondary | ICD-10-CM | POA: Diagnosis not present

## 2024-01-27 DIAGNOSIS — H2513 Age-related nuclear cataract, bilateral: Secondary | ICD-10-CM | POA: Diagnosis not present

## 2024-01-27 DIAGNOSIS — H524 Presbyopia: Secondary | ICD-10-CM | POA: Diagnosis not present

## 2024-01-27 DIAGNOSIS — H52223 Regular astigmatism, bilateral: Secondary | ICD-10-CM | POA: Diagnosis not present

## 2024-03-23 ENCOUNTER — Encounter: Payer: Self-pay | Admitting: Family Medicine

## 2024-03-23 ENCOUNTER — Ambulatory Visit: Admitting: Family Medicine

## 2024-03-23 VITALS — BP 133/80 | HR 66 | Ht 60.0 in | Wt 138.2 lb

## 2024-03-23 DIAGNOSIS — E785 Hyperlipidemia, unspecified: Secondary | ICD-10-CM

## 2024-03-23 DIAGNOSIS — R1909 Other intra-abdominal and pelvic swelling, mass and lump: Secondary | ICD-10-CM | POA: Insufficient documentation

## 2024-03-23 DIAGNOSIS — Z862 Personal history of diseases of the blood and blood-forming organs and certain disorders involving the immune mechanism: Secondary | ICD-10-CM

## 2024-03-23 DIAGNOSIS — R7303 Prediabetes: Secondary | ICD-10-CM

## 2024-03-23 DIAGNOSIS — K7689 Other specified diseases of liver: Secondary | ICD-10-CM | POA: Diagnosis not present

## 2024-03-23 DIAGNOSIS — R7309 Other abnormal glucose: Secondary | ICD-10-CM

## 2024-03-23 NOTE — Assessment & Plan Note (Addendum)
 Benign liver cyst (history, under monitoring) Benign liver cyst. Monitoring liver enzymes for changes. - Ordered blood test to check liver enzymes.

## 2024-03-23 NOTE — Assessment & Plan Note (Signed)
-   FLP checked - I will call with her test results

## 2024-03-23 NOTE — Progress Notes (Signed)
    SUBJECTIVE:   CHIEF COMPLAINT / HPI:   Discussed the use of AI scribe software for clinical note transcription with the patient, who gave verbal consent to proceed.  History of Present Illness   Sherry Wilkinson is a 78 year old female who presents with a persistent cyst in the groin area.  She has a persistent cyst in the groin area that has changed in size and color over time, initially being round and now appearing elongated.  The cyst causes discomfort, particularly when pressed, but there is no discharge or redness. It was more bothersome the night before she called for an appointment. She has a cyst removed from the location in 2022. However, since she returned for suture removal, it feels like there is something still in the area that is bothersome.  An ultrasound was performed on September 30, 2023, for evaluation of the groin area. She is concerned about the cyst and its management, noting previous experiences with cyst removal in 2022 from the mons pubis area.  Her medication regimen includes Vitamin D3, Vitamin B12, and Vitamin C. She takes an iron supplement intermittently, approximately once a week, as it makes her feel jittery. She reports increased appetite when taking the iron supplement.  She has a history of a cyst in the liver. She also had a Bartholin cyst removed in 2016 and a cyst removed from the mons pubis in 2022.  No leg swelling.       PERTINENT  PMH / PSH: PMHx reviewed  OBJECTIVE:   BP 133/80   Pulse 66   Ht 5' (1.524 m)   Wt 138 lb 3.2 oz (62.7 kg)   SpO2 98%   BMI 26.99 kg/m   Physical Exam   VITALS: BP- 133/80 GEN: No distress. Well-appearing elderly patient CHEST: Lungs clear to auscultation bilaterally. CARDIOVASCULAR: Heart regular rate and rhythm, no murmurs. ABDOMEN: Normal bowel sounds, no tenderness, no distension. GENITOURINARY: Very small barely palpable mass in groin, on her right mons pubis area with hyperpigmented scaring on it,  mildly tender on palpation. Chaperone - Thersia Gallo EXT: No edema       ASSESSMENT/PLAN:   Assessment & Plan Groin mass Solid mass in right groin/mons pubis area (post-cyst removal in 2022) Solid mass in groin, previously evaluated by ultrasound, now elongated. - Ordered CT scan of groin area. - Schedule follow-up based on CT results to determine need for dermatology or gynecology consultation. Hyperlipidemia, unspecified hyperlipidemia type - FLP checked - I will call with her test results Prediabetes A1C checked  History of anemia - Anemia panel checked today  Liver cyst Benign liver cyst (history, under monitoring) Benign liver cyst. Monitoring liver enzymes for changes. - Ordered blood test to check liver enzymes.     Otto Fairly, MD [facility]

## 2024-03-23 NOTE — Patient Instructions (Signed)
 It was nice seeing you today. We will go ahead with obtaining CT scan of your pelvic to further assess the area. Fllow up plan pending CT scan result.

## 2024-03-23 NOTE — Assessment & Plan Note (Signed)
 Solid mass in right groin/mons pubis area (post-cyst removal in 2022) Solid mass in groin, previously evaluated by ultrasound, now elongated. - Ordered CT scan of groin area. - Schedule follow-up based on CT results to determine need for dermatology or gynecology consultation.

## 2024-03-23 NOTE — Assessment & Plan Note (Signed)
A1C checked

## 2024-03-24 ENCOUNTER — Encounter: Payer: Self-pay | Admitting: Family Medicine

## 2024-03-24 ENCOUNTER — Telehealth: Payer: Self-pay | Admitting: Family Medicine

## 2024-03-24 ENCOUNTER — Ambulatory Visit: Payer: Self-pay | Admitting: Family Medicine

## 2024-03-24 LAB — ANEMIA PROFILE B
Basophils Absolute: 0.1 x10E3/uL (ref 0.0–0.2)
Basos: 1 %
EOS (ABSOLUTE): 0.1 x10E3/uL (ref 0.0–0.4)
Eos: 1 %
Ferritin: 30 ng/mL (ref 15–150)
Folate: >20 ng/mL (ref >3.0)
Hematocrit: 39.8 % (ref 34.0–46.6)
Hemoglobin: 13.1 g/dL (ref 11.1–15.9)
Immature Grans (Abs): 0 x10E3/uL (ref 0.0–0.1)
Immature Granulocytes: 0 %
Iron Saturation: 24 % (ref 15–55)
Iron: 79 ug/dL (ref 27–139)
Lymphocytes Absolute: 2.4 x10E3/uL (ref 0.7–3.1)
Lymphs: 36 %
MCH: 30.3 pg (ref 26.6–33.0)
MCHC: 32.9 g/dL (ref 31.5–35.7)
MCV: 92 fL (ref 79–97)
Monocytes Absolute: 0.5 x10E3/uL (ref 0.1–0.9)
Monocytes: 8 %
Neutrophils Absolute: 3.6 x10E3/uL (ref 1.4–7.0)
Neutrophils: 54 %
Platelets: 281 x10E3/uL (ref 150–450)
RBC: 4.32 x10E6/uL (ref 3.77–5.28)
RDW: 13.2 % (ref 11.7–15.4)
Retic Ct Pct: 1.1 % (ref 0.6–2.6)
Total Iron Binding Capacity: 323 ug/dL (ref 250–450)
UIBC: 244 ug/dL (ref 118–369)
Vitamin B-12: 586 pg/mL (ref 232–1245)
WBC: 6.7 x10E3/uL (ref 3.4–10.8)

## 2024-03-24 LAB — CMP14+EGFR
ALT: 17 IU/L (ref 0–32)
AST: 24 IU/L (ref 0–40)
Albumin: 4.3 g/dL (ref 3.8–4.8)
Alkaline Phosphatase: 100 IU/L (ref 49–135)
BUN/Creatinine Ratio: 6 — ABNORMAL LOW (ref 12–28)
BUN: 6 mg/dL — ABNORMAL LOW (ref 8–27)
Bilirubin Total: 0.5 mg/dL (ref 0.0–1.2)
CO2: 23 mmol/L (ref 20–29)
Calcium: 9.6 mg/dL (ref 8.7–10.3)
Chloride: 106 mmol/L (ref 96–106)
Creatinine, Ser: 1.06 mg/dL — ABNORMAL HIGH (ref 0.57–1.00)
Globulin, Total: 2.4 g/dL (ref 1.5–4.5)
Glucose: 91 mg/dL (ref 70–99)
Potassium: 4.1 mmol/L (ref 3.5–5.2)
Sodium: 144 mmol/L (ref 134–144)
Total Protein: 6.7 g/dL (ref 6.0–8.5)
eGFR: 54 mL/min/1.73 — ABNORMAL LOW (ref >59)

## 2024-03-24 LAB — LIPID PANEL
Chol/HDL Ratio: 3 ratio (ref 0.0–4.4)
Cholesterol, Total: 220 mg/dL — ABNORMAL HIGH (ref 100–199)
HDL: 74 mg/dL (ref >39)
LDL Chol Calc (NIH): 130 mg/dL — ABNORMAL HIGH (ref 0–99)
Triglycerides: 90 mg/dL (ref 0–149)
VLDL Cholesterol Cal: 16 mg/dL (ref 5–40)

## 2024-03-24 LAB — HEMOGLOBIN A1C
Est. average glucose Bld gHb Est-mCnc: 114 mg/dL
Hgb A1c MFr Bld: 5.6 % (ref 4.8–5.6)

## 2024-03-24 NOTE — Telephone Encounter (Signed)
 Result discussed.  Creatinine is mildly elevated, likely due to dehydration. Improve hydration and recheck in 4-8 weeks.  ASCVD risk score:23.4 PREVENT score:16. 8 Statin discussed. She prefers lifestyle modification. Repat FLP in 1 yr.   A1C looks good.  Hemoglobin within range. Ferritin and Iron Sat are barely above normal. May continue Fe supplement every other day.  She agreed with the plan.

## 2024-04-21 NOTE — Progress Notes (Signed)
 Left a voicemail for the patient about her appt which is Jan 8th @130  at [facility]

## 2024-04-29 ENCOUNTER — Ambulatory Visit (HOSPITAL_COMMUNITY)
Admission: RE | Admit: 2024-04-29 | Discharge: 2024-04-29 | Disposition: A | Discharge status: Home / Self Care | Source: Ambulatory Visit | Attending: Family Medicine | Admitting: Family Medicine

## 2024-04-29 DIAGNOSIS — R1909 Other intra-abdominal and pelvic swelling, mass and lump: Secondary | ICD-10-CM | POA: Insufficient documentation

## 2024-04-29 MED ORDER — IOHEXOL 300 MG/ML  SOLN
100.0000 mL | Freq: Once | INTRAMUSCULAR | Status: AC | PRN
  Administered 2024-04-29: 100 mL via INTRAVENOUS

## 2024-04-29 NOTE — Telephone Encounter (Signed)
 Tried reaching out to radiology to see why the order needed to be changed but they did not answer.

## 2024-05-07 ENCOUNTER — Encounter: Payer: Self-pay | Admitting: Family Medicine

## 2024-05-07 NOTE — Telephone Encounter (Signed)
 Unable to reach her about test results on both phone numbers listed. Result letter sent.   HIPAA compliant callback message left.  Please advise her when she calls about her test result -  No lump or mass was found in the area of concern on her mons pubis.  There is a small (2.1 cm) spot in the uterus that is most likely a fibroid, which is a common, benign (non-cancerous) growth. Another benign condition called adenomyosis could look similar. A small dense spot in the lower lumbar spine (L4) was noted. Because it has been there since 2014 and has not changed, it is most likely a harmless bone finding. Overall, the findings are reassuring.

## 2024-07-29 ENCOUNTER — Encounter
